# Patient Record
Sex: Female | Born: 2000
Health system: Southern US, Community
[De-identification: ages and names within clinical notes are randomized; demographics above are authoritative.]

## PROBLEM LIST (undated history)

## (undated) DIAGNOSIS — E559 Vitamin D deficiency, unspecified: Secondary | ICD-10-CM

## (undated) DIAGNOSIS — Z8659 Personal history of other mental and behavioral disorders: Secondary | ICD-10-CM

## (undated) DIAGNOSIS — J302 Other seasonal allergic rhinitis: Secondary | ICD-10-CM

## (undated) DIAGNOSIS — R159 Full incontinence of feces: Secondary | ICD-10-CM

## (undated) DIAGNOSIS — F419 Anxiety disorder, unspecified: Secondary | ICD-10-CM

## (undated) DIAGNOSIS — Z973 Presence of spectacles and contact lenses: Secondary | ICD-10-CM

## (undated) DIAGNOSIS — R109 Unspecified abdominal pain: Secondary | ICD-10-CM

## (undated) DIAGNOSIS — R195 Other fecal abnormalities: Secondary | ICD-10-CM

## (undated) DIAGNOSIS — O034 Incomplete spontaneous abortion without complication: Secondary | ICD-10-CM

## (undated) DIAGNOSIS — G43909 Migraine, unspecified, not intractable, without status migrainosus: Secondary | ICD-10-CM

## (undated) HISTORY — DX: Other fecal abnormalities: R19.5

## (undated) HISTORY — PX: NO PAST SURGERIES: SHX2092

## (undated) HISTORY — DX: Full incontinence of feces: R15.9

## (undated) HISTORY — DX: Unspecified abdominal pain: R10.9

---
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
## 2019-01-13 DIAGNOSIS — Z20828 Contact with and (suspected) exposure to other viral communicable diseases: Secondary | ICD-10-CM | POA: Diagnosis not present

## 2019-03-22 LAB — OB RESULTS CONSOLE GC/CHLAMYDIA
Chlamydia: NEGATIVE
Gonorrhea: NEGATIVE

## 2019-03-22 LAB — OB RESULTS CONSOLE GBS: GBS: NEGATIVE

## 2019-04-14 ENCOUNTER — Encounter (HOSPITAL_COMMUNITY): Payer: Self-pay

## 2019-04-14 ENCOUNTER — Other Ambulatory Visit: Payer: Self-pay

## 2019-04-14 ENCOUNTER — Inpatient Hospital Stay (HOSPITAL_COMMUNITY)
Admission: AD | Admit: 2019-04-14 | Discharge: 2019-04-17 | DRG: 807 | Disposition: A | Discharge status: Home / Self Care | Payer: BC Managed Care – PPO | Attending: Obstetrics and Gynecology | Admitting: Obstetrics and Gynecology

## 2019-04-14 ENCOUNTER — Inpatient Hospital Stay (EMERGENCY_DEPARTMENT_HOSPITAL)
Admission: AD | Admit: 2019-04-14 | Discharge: 2019-04-14 | Disposition: A | Discharge status: Home / Self Care | Payer: BC Managed Care – PPO | Source: Ambulatory Visit | Attending: Obstetrics & Gynecology | Admitting: Obstetrics & Gynecology

## 2019-04-14 DIAGNOSIS — Z20828 Contact with and (suspected) exposure to other viral communicable diseases: Secondary | ICD-10-CM | POA: Diagnosis not present

## 2019-04-14 DIAGNOSIS — Z3A39 39 weeks gestation of pregnancy: Secondary | ICD-10-CM

## 2019-04-14 DIAGNOSIS — O479 False labor, unspecified: Secondary | ICD-10-CM | POA: Diagnosis not present

## 2019-04-14 DIAGNOSIS — O26893 Other specified pregnancy related conditions, third trimester: Secondary | ICD-10-CM | POA: Diagnosis not present

## 2019-04-14 DIAGNOSIS — O471 False labor at or after 37 completed weeks of gestation: Secondary | ICD-10-CM | POA: Insufficient documentation

## 2019-04-14 LAB — CBC
HCT: 36.7 % (ref 36.0–46.0)
Hemoglobin: 11.9 g/dL — ABNORMAL LOW (ref 12.0–15.0)
MCH: 27.7 pg (ref 26.0–34.0)
MCHC: 32.4 g/dL (ref 30.0–36.0)
MCV: 85.3 fL (ref 80.0–100.0)
Platelets: 266 10*3/uL (ref 150–400)
RBC: 4.3 MIL/uL (ref 3.87–5.11)
RDW: 13 % (ref 11.5–15.5)
WBC: 14.2 10*3/uL — ABNORMAL HIGH (ref 4.0–10.5)
nRBC: 0 % (ref 0.0–0.2)

## 2019-04-14 LAB — OB RESULTS CONSOLE ABO/RH: RH Type: POSITIVE

## 2019-04-14 LAB — SARS CORONAVIRUS 2 BY RT PCR (HOSPITAL ORDER, PERFORMED IN ~~LOC~~ HOSPITAL LAB): SARS Coronavirus 2: NEGATIVE

## 2019-04-14 MED ORDER — LACTATED RINGERS IV SOLN
INTRAVENOUS | Status: DC
  Administered 2019-04-14 – 2019-04-15 (×4): via INTRAVENOUS

## 2019-04-14 MED ORDER — EPHEDRINE 5 MG/ML INJ: 10.0000 mg | INTRAVENOUS | Status: DC | PRN

## 2019-04-14 MED ORDER — PHENYLEPHRINE 40 MCG/ML (10ML) SYRINGE FOR IV PUSH (FOR BLOOD PRESSURE SUPPORT): 80.0000 ug | PREFILLED_SYRINGE | INTRAVENOUS | Status: DC | PRN

## 2019-04-14 MED ORDER — OXYCODONE-ACETAMINOPHEN 5-325 MG PO TABS: 1.0000 | ORAL_TABLET | ORAL | Status: DC | PRN

## 2019-04-14 MED ORDER — DIPHENHYDRAMINE HCL 50 MG/ML IJ SOLN: 12.5000 mg | INTRAMUSCULAR | Status: DC | PRN

## 2019-04-14 MED ORDER — ACETAMINOPHEN 325 MG PO TABS: 650.0000 mg | ORAL_TABLET | ORAL | Status: DC | PRN

## 2019-04-14 MED ORDER — FLEET ENEMA 7-19 GM/118ML RE ENEM: 1.0000 | ENEMA | RECTAL | Status: DC | PRN

## 2019-04-14 MED ORDER — SOD CITRATE-CITRIC ACID 500-334 MG/5ML PO SOLN: 30.0000 mL | ORAL | Status: DC | PRN

## 2019-04-14 MED ORDER — LACTATED RINGERS IV SOLN
500.0000 mL | Freq: Once | INTRAVENOUS | Status: AC
  Administered 2019-04-14: 500 mL via INTRAVENOUS

## 2019-04-14 MED ORDER — ONDANSETRON HCL 4 MG/2ML IJ SOLN: 4.0000 mg | Freq: Four times a day (QID) | INTRAMUSCULAR | Status: DC | PRN

## 2019-04-14 MED ORDER — LIDOCAINE HCL (PF) 1 % IJ SOLN: 30.0000 mL | INTRAMUSCULAR | Status: DC | PRN

## 2019-04-14 MED ORDER — OXYCODONE-ACETAMINOPHEN 5-325 MG PO TABS: 2.0000 | ORAL_TABLET | ORAL | Status: DC | PRN

## 2019-04-14 MED ORDER — OXYTOCIN 40 UNITS IN NORMAL SALINE INFUSION - SIMPLE MED
2.5000 [IU]/h | INTRAVENOUS | Status: DC
  Filled 2019-04-14: qty 1000

## 2019-04-14 MED ORDER — LACTATED RINGERS IV SOLN: 500.0000 mL | INTRAVENOUS | Status: DC | PRN

## 2019-04-14 MED ORDER — OXYTOCIN BOLUS FROM INFUSION
500.0000 mL | Freq: Once | INTRAVENOUS | Status: AC
  Administered 2019-04-15: 500 mL via INTRAVENOUS

## 2019-04-14 MED ORDER — FENTANYL-BUPIVACAINE-NACL 0.5-0.125-0.9 MG/250ML-% EP SOLN
12.0000 mL/h | EPIDURAL | Status: DC | PRN
  Filled 2019-04-14: qty 250

## 2019-04-14 NOTE — Discharge Instructions (Signed)
Braxton Hicks Contractions Contractions of the uterus can occur throughout pregnancy, but they are not always a sign that you are in labor. You may have practice contractions called Braxton Hicks contractions. These false labor contractions are sometimes confused with true labor. What are Braxton Hicks contractions? Braxton Hicks contractions are tightening movements that occur in the muscles of the uterus before labor. Unlike true labor contractions, these contractions do not result in opening (dilation) and thinning of the cervix. Toward the end of pregnancy (32-34 weeks), Braxton Hicks contractions can happen more often and may become stronger. These contractions are sometimes difficult to tell apart from true labor because they can be very uncomfortable. You should not feel embarrassed if you go to the hospital with false labor. Sometimes, the only way to tell if you are in true labor is for your health care provider to look for changes in the cervix. The health care provider will do a physical exam and may monitor your contractions. If you are not in true labor, the exam should show that your cervix is not dilating and your water has not broken. If there are no other health problems associated with your pregnancy, it is completely safe for you to be sent home with false labor. You may continue to have Braxton Hicks contractions until you go into true labor. How to tell the difference between true labor and false labor True labor  Contractions last 30-70 seconds.  Contractions become very regular.  Discomfort is usually felt in the top of the uterus, and it spreads to the lower abdomen and low back.  Contractions do not go away with walking.  Contractions usually become more intense and increase in frequency.  The cervix dilates and gets thinner. False labor  Contractions are usually shorter and not as strong as true labor contractions.  Contractions are usually irregular.  Contractions  are often felt in the front of the lower abdomen and in the groin.  Contractions may go away when you walk around or change positions while lying down.  Contractions get weaker and are shorter-lasting as time goes on.  The cervix usually does not dilate or become thin. Follow these instructions at home:   Take over-the-counter and prescription medicines only as told by your health care provider.  Keep up with your usual exercises and follow other instructions from your health care provider.  Eat and drink lightly if you think you are going into labor.  If Braxton Hicks contractions are making you uncomfortable: ? Change your position from lying down or resting to walking, or change from walking to resting. ? Sit and rest in a tub of warm water. ? Drink enough fluid to keep your urine pale yellow. Dehydration may cause these contractions. ? Do slow and deep breathing several times an hour.  Keep all follow-up prenatal visits as told by your health care provider. This is important. Contact a health care provider if:  You have a fever.  You have continuous pain in your abdomen. Get help right away if:  Your contractions become stronger, more regular, and closer together.  You have fluid leaking or gushing from your vagina.  You pass blood-tinged mucus (bloody show).  You have bleeding from your vagina.  You have low back pain that you never had before.  You feel your baby's head pushing down and causing pelvic pressure.  Your baby is not moving inside you as much as it used to. Summary  Contractions that occur before labor are   called Braxton Hicks contractions, false labor, or practice contractions.  Braxton Hicks contractions are usually shorter, weaker, farther apart, and less regular than true labor contractions. True labor contractions usually become progressively stronger and regular, and they become more frequent.  Manage discomfort from Braxton Hicks contractions  by changing position, resting in a warm bath, drinking plenty of water, or practicing deep breathing. This information is not intended to replace advice given to you by your health care provider. Make sure you discuss any questions you have with your health care provider. Document Released: 11/21/2016 Document Revised: 06/20/2017 Document Reviewed: 11/21/2016 Elsevier Patient Education  2020 Elsevier Inc.  

## 2019-04-14 NOTE — MAU Note (Signed)
Contractions 3-4 mins apart.  No LOF/VB.  + FM.  Reports no complications w/ pregnancy.  1 cm last week in the office.

## 2019-04-14 NOTE — MAU Note (Signed)
CTX 4 mins apart.  No LOF/VB.  + FM.  States she was 3-4 cm in the office today.  Also feeling pressure.

## 2019-04-14 NOTE — MAU Provider Note (Signed)
S: Ms. Diane Marshall is a 18 y.o. G1P0 at [redacted]w[redacted]d  who presents to MAU today for labor evaluation.     Cervical exam by RN:  Dilation: 2.5 Effacement (%): 70 Cervical Position: Middle Station: -1 Presentation: Vertex Exam by:: Esau Grew, RN  Fetal Monitoring: Baseline: 140 Variability: average Accelerations: present Decelerations: absent Contractions: every 5-6 min, mild  MDM Discussed patient with RN. NST reviewed.   A: SIUP at [redacted]w[redacted]d  False labor  P: Discharge home Labor precautions and kick counts included in AVS Patient to follow-up with office as scheduled  Patient may return to MAU as needed or when in labor   Kennis Carina 04/14/2019 6:27 AM

## 2019-04-14 NOTE — Anesthesia Preprocedure Evaluation (Signed)
Anesthesia Evaluation  Patient identified by MRN, date of birth, ID band Patient awake    Reviewed: Allergy & Precautions, Patient's Chart, lab work & pertinent test results  Airway Mallampati: II  TM Distance: >3 FB Neck ROM: Full    Dental no notable dental hx.    Pulmonary neg pulmonary ROS,    Pulmonary exam normal breath sounds clear to auscultation       Cardiovascular negative cardio ROS Normal cardiovascular exam Rhythm:Regular Rate:Normal     Neuro/Psych negative neurological ROS  negative psych ROS   GI/Hepatic negative GI ROS, Neg liver ROS,   Endo/Other  negative endocrine ROS  Renal/GU negative Renal ROS  negative genitourinary   Musculoskeletal negative musculoskeletal ROS (+)   Abdominal   Peds negative pediatric ROS (+)  Hematology negative hematology ROS (+)   Anesthesia Other Findings   Reproductive/Obstetrics (+) Pregnancy                             Anesthesia Physical Anesthesia Plan  ASA: I  Anesthesia Plan: Epidural   Post-op Pain Management:    Induction:   PONV Risk Score and Plan: 2  Airway Management Planned: Natural Airway  Additional Equipment: None  Intra-op Plan:   Post-operative Plan:   Informed Consent: I have reviewed the patients History and Physical, chart, labs and discussed the procedure including the risks, benefits and alternatives for the proposed anesthesia with the patient or authorized representative who has indicated his/her understanding and acceptance.       Plan Discussed with:   Anesthesia Plan Comments:         Anesthesia Quick Evaluation

## 2019-04-15 ENCOUNTER — Inpatient Hospital Stay (HOSPITAL_COMMUNITY): Payer: BC Managed Care – PPO | Admitting: Anesthesiology

## 2019-04-15 ENCOUNTER — Encounter (HOSPITAL_COMMUNITY): Payer: Self-pay | Admitting: *Deleted

## 2019-04-15 LAB — RPR: RPR Ser Ql: NONREACTIVE

## 2019-04-15 LAB — TYPE AND SCREEN
ABO/RH(D): O POS
Antibody Screen: NEGATIVE

## 2019-04-15 LAB — ABO/RH: ABO/RH(D): O POS

## 2019-04-15 MED ORDER — SODIUM CHLORIDE 0.9 % IV SOLN: 250.0000 mL | INTRAVENOUS | Status: DC | PRN

## 2019-04-15 MED ORDER — SENNOSIDES-DOCUSATE SODIUM 8.6-50 MG PO TABS
2.0000 | ORAL_TABLET | ORAL | Status: DC
  Administered 2019-04-15 – 2019-04-17 (×3): 2 via ORAL
  Filled 2019-04-15 (×2): qty 2

## 2019-04-15 MED ORDER — ONDANSETRON HCL 4 MG PO TABS: 4.0000 mg | ORAL_TABLET | ORAL | Status: DC | PRN

## 2019-04-15 MED ORDER — ZOLPIDEM TARTRATE 5 MG PO TABS: 5.0000 mg | ORAL_TABLET | Freq: Every evening | ORAL | Status: DC | PRN

## 2019-04-15 MED ORDER — SIMETHICONE 80 MG PO CHEW: 80.0000 mg | CHEWABLE_TABLET | ORAL | Status: DC | PRN

## 2019-04-15 MED ORDER — ACETAMINOPHEN 325 MG PO TABS
650.0000 mg | ORAL_TABLET | ORAL | Status: DC | PRN
  Administered 2019-04-16 – 2019-04-17 (×4): 650 mg via ORAL
  Filled 2019-04-15 (×4): qty 2

## 2019-04-15 MED ORDER — SODIUM CHLORIDE 0.9% FLUSH: 3.0000 mL | Freq: Two times a day (BID) | INTRAVENOUS | Status: DC

## 2019-04-15 MED ORDER — METHYLERGONOVINE MALEATE 0.2 MG PO TABS: 0.2000 mg | ORAL_TABLET | ORAL | Status: DC | PRN

## 2019-04-15 MED ORDER — SODIUM CHLORIDE (PF) 0.9 % IJ SOLN
INTRAMUSCULAR | Status: DC | PRN
  Administered 2019-04-15: 12 mL/h via EPIDURAL

## 2019-04-15 MED ORDER — BENZOCAINE-MENTHOL 20-0.5 % EX AERO: 1.0000 "application " | INHALATION_SPRAY | CUTANEOUS | Status: DC | PRN

## 2019-04-15 MED ORDER — WITCH HAZEL-GLYCERIN EX PADS: 1.0000 "application " | MEDICATED_PAD | CUTANEOUS | Status: DC | PRN

## 2019-04-15 MED ORDER — MEASLES, MUMPS & RUBELLA VAC IJ SOLR: 0.5000 mL | Freq: Once | INTRAMUSCULAR | Status: DC

## 2019-04-15 MED ORDER — PRENATAL MULTIVITAMIN CH
1.0000 | ORAL_TABLET | Freq: Every day | ORAL | Status: DC
  Administered 2019-04-16 – 2019-04-17 (×2): 1 via ORAL
  Filled 2019-04-15 (×2): qty 1

## 2019-04-15 MED ORDER — MAGNESIUM HYDROXIDE 400 MG/5ML PO SUSP: 30.0000 mL | ORAL | Status: DC | PRN

## 2019-04-15 MED ORDER — DIBUCAINE (PERIANAL) 1 % EX OINT: 1.0000 "application " | TOPICAL_OINTMENT | CUTANEOUS | Status: DC | PRN

## 2019-04-15 MED ORDER — FERROUS SULFATE 325 (65 FE) MG PO TABS
325.0000 mg | ORAL_TABLET | Freq: Two times a day (BID) | ORAL | Status: DC
  Administered 2019-04-15 – 2019-04-17 (×4): 325 mg via ORAL
  Filled 2019-04-15 (×4): qty 1

## 2019-04-15 MED ORDER — METHYLERGONOVINE MALEATE 0.2 MG/ML IJ SOLN: 0.2000 mg | INTRAMUSCULAR | Status: DC | PRN

## 2019-04-15 MED ORDER — COCONUT OIL OIL: 1.0000 "application " | TOPICAL_OIL | Status: DC | PRN

## 2019-04-15 MED ORDER — SODIUM CHLORIDE 0.9% FLUSH: 3.0000 mL | INTRAVENOUS | Status: DC | PRN

## 2019-04-15 MED ORDER — DIPHENHYDRAMINE HCL 25 MG PO CAPS: 25.0000 mg | ORAL_CAPSULE | Freq: Four times a day (QID) | ORAL | Status: DC | PRN

## 2019-04-15 MED ORDER — LIDOCAINE HCL (PF) 1 % IJ SOLN
INTRAMUSCULAR | Status: DC | PRN
  Administered 2019-04-15: 10 mL via EPIDURAL
  Administered 2019-04-15: 2 mL via EPIDURAL

## 2019-04-15 MED ORDER — ONDANSETRON HCL 4 MG/2ML IJ SOLN: 4.0000 mg | INTRAMUSCULAR | Status: DC | PRN

## 2019-04-15 MED ORDER — IBUPROFEN 800 MG PO TABS
800.0000 mg | ORAL_TABLET | Freq: Three times a day (TID) | ORAL | Status: DC
  Administered 2019-04-15 – 2019-04-17 (×6): 800 mg via ORAL
  Filled 2019-04-15 (×6): qty 1

## 2019-04-15 MED ORDER — TETANUS-DIPHTH-ACELL PERTUSSIS 5-2.5-18.5 LF-MCG/0.5 IM SUSP: 0.5000 mL | Freq: Once | INTRAMUSCULAR | Status: DC

## 2019-04-15 MED ORDER — OXYCODONE-ACETAMINOPHEN 5-325 MG PO TABS: 2.0000 | ORAL_TABLET | ORAL | Status: DC | PRN

## 2019-04-15 NOTE — H&P (Signed)
18 y.o. [redacted]w[redacted]d  G1P0000 comes in c/o labor.  Otherwise has good fetal movement and no bleeding.  Past Medical History:  Diagnosis Date  . Abdominal pain   . Encopresis   . Loose stools     Past Surgical History:  Procedure Laterality Date  . NO PAST SURGERIES      OB History  Gravida Para Term Preterm AB Living  1 0 0 0 0 0  SAB TAB Ectopic Multiple Live Births  0 0 0 0 0    # Outcome Date GA Lbr Len/2nd Weight Sex Delivery Anes PTL Lv  1 Current             Social History   Socioeconomic History  . Marital status: Single    Spouse name: Not on file  . Number of children: Not on file  . Years of education: Not on file  . Highest education level: Not on file  Occupational History  . Not on file  Social Needs  . Financial resource strain: Not on file  . Food insecurity    Worry: Not on file    Inability: Not on file  . Transportation needs    Medical: Not on file    Non-medical: Not on file  Tobacco Use  . Smoking status: Never Smoker  . Smokeless tobacco: Never Used  Substance and Sexual Activity  . Alcohol use: Never    Frequency: Never  . Drug use: Never  . Sexual activity: Yes  Lifestyle  . Physical activity    Days per week: Not on file    Minutes per session: Not on file  . Stress: Not on file  Relationships  . Social Herbalist on phone: Not on file    Gets together: Not on file    Attends religious service: Not on file    Active member of club or organization: Not on file    Attends meetings of clubs or organizations: Not on file    Relationship status: Not on file  . Intimate partner violence    Fear of current or ex partner: Not on file    Emotionally abused: Not on file    Physically abused: Not on file    Forced sexual activity: Not on file  Other Topics Concern  . Not on file  Social History Narrative   8th grade 2014-2015   Patient has no known allergies.    Prenatal Transfer Tool  Maternal Diabetes: No Genetic Screening:  Normal Maternal Ultrasounds/Referrals: Normal Fetal Ultrasounds or other Referrals:  None Maternal Substance Abuse:  No Significant Maternal Medications:  None Significant Maternal Lab Results: Group B Strep negative  Other PNC: uncomplicated.    Vitals:   04/15/19 0936 04/15/19 1001 04/15/19 1031 04/15/19 1101  BP: 125/72 (!) 104/58 116/72 127/81  Pulse: 96 (!) 103 (!) 102 (!) 106  Resp: 18 18 18 16   Temp:      TempSrc:      SpO2:      Weight:      Height:        Lungs/Cor:  NAD Abdomen:  soft, gravid Ex:  no cords, erythema SVE:  4 on admission FHTs:  good STV, NST R; Cat 1 tracing. Toco:  q q 3-4   A/P   Term labor.   GBS neg.  Daria Pastures

## 2019-04-15 NOTE — Anesthesia Procedure Notes (Signed)
Epidural Patient location during procedure: OB Start time: 04/15/2019 12:01 AM End time: 04/15/2019 12:10 AM  Staffing Anesthesiologist: Pervis Hocking, DO Performed: anesthesiologist   Preanesthetic Checklist Completed: patient identified, pre-op evaluation, timeout performed, IV checked, risks and benefits discussed and monitors and equipment checked  Epidural Patient position: sitting Prep: site prepped and draped and DuraPrep Patient monitoring: continuous pulse ox, blood pressure, heart rate and cardiac monitor Approach: midline Location: L3-L4 Injection technique: LOR air  Needle:  Needle type: Tuohy  Needle gauge: 17 G Needle length: 9 cm Needle insertion depth: 6 cm Catheter type: closed end flexible Catheter size: 19 Gauge Catheter at skin depth: 12 cm Test dose: negative  Assessment Sensory level: T8 Events: blood not aspirated, injection not painful, no injection resistance, negative IV test and no paresthesia  Additional Notes Patient identified. Risks/Benefits/Options discussed with patient including but not limited to bleeding, infection, nerve damage, paralysis, failed block, incomplete pain control, headache, blood pressure changes, nausea, vomiting, reactions to medication both or allergic, itching and postpartum back pain. Confirmed with bedside nurse the patient's most recent platelet count. Confirmed with patient that they are not currently taking any anticoagulation, have any bleeding history or any family history of bleeding disorders. Patient expressed understanding and wished to proceed. All questions were answered. Sterile technique was used throughout the entire procedure. Please see nursing notes for vital signs. Test dose was given through epidural catheter and negative prior to continuing to dose epidural or start infusion. Warning signs of high block given to the patient including shortness of breath, tingling/numbness in hands, complete motor  block, or any concerning symptoms with instructions to call for help. Patient was given instructions on fall risk and not to get out of bed. All questions and concerns addressed with instructions to call with any issues or inadequate analgesia.  Reason for block:procedure for pain

## 2019-04-16 LAB — CBC
HCT: 32.7 % — ABNORMAL LOW (ref 36.0–46.0)
Hemoglobin: 10.5 g/dL — ABNORMAL LOW (ref 12.0–15.0)
MCH: 27.7 pg (ref 26.0–34.0)
MCHC: 32.1 g/dL (ref 30.0–36.0)
MCV: 86.3 fL (ref 80.0–100.0)
Platelets: 253 10*3/uL (ref 150–400)
RBC: 3.79 MIL/uL — ABNORMAL LOW (ref 3.87–5.11)
RDW: 13.2 % (ref 11.5–15.5)
WBC: 11.9 10*3/uL — ABNORMAL HIGH (ref 4.0–10.5)
nRBC: 0 % (ref 0.0–0.2)

## 2019-04-16 NOTE — Anesthesia Postprocedure Evaluation (Signed)
Anesthesia Post Note  Patient: Diane Marshall  Procedure(s) Performed: AN AD HOC LABOR EPIDURAL     Patient location during evaluation: Mother Baby Anesthesia Type: Epidural Level of consciousness: awake and alert and oriented Pain management: satisfactory to patient Vital Signs Assessment: post-procedure vital signs reviewed and stable Respiratory status: spontaneous breathing and nonlabored ventilation Cardiovascular status: stable Postop Assessment: no headache, no backache, no signs of nausea or vomiting, adequate PO intake, patient able to bend at knees and able to ambulate (patient up walking) Anesthetic complications: no    Last Vitals:  Vitals:   04/16/19 0000 04/16/19 0400  BP: (!) 109/56 (!) 112/54  Pulse: 89 96  Resp: 18 18  Temp: 36.6 C 36.7 C  SpO2: 100% 98%    Last Pain:  Vitals:   04/16/19 0514  TempSrc:   PainSc: 5    Pain Goal: Patients Stated Pain Goal: 9 (04/15/19 0715)                 Willa Rough

## 2019-04-16 NOTE — Lactation Note (Signed)
This note was copied from a baby's chart. Lactation Consultation Note  Patient Name: Diane Marshall [id number] Date: 04/16/2019 Reason for consult: Initial assessment;1st time breastfeeding;Term P1, 68 hour female infant. Infant had 2 voids and 3 stools since birth. Mom is active on the [facility] program in [facility] and mom has DEBP at home. Per mom, she feels breastfeeding is going well, infant latched in L&D for 20 minutes and infant is breastfeeding 10-20 minutes most feedings. Mom latched infant on left breast using cross cradle hold, swallows were observed and nose and chin was touching breast. Mom latched infant without assistance from [facility] and mom was still breastfeeding after 8 minutes when LC left the room. Mom knows to breastfeed infant according hunger cues, 8 to 12 times within 24 hours and on demand. Reviewed Baby & Me book's Breastfeeding Basics.  Mom made aware of O/P services, breastfeeding support groups, community resources, and our phone # for post-discharge questions.   Maternal Data Formula Feeding for Exclusion: No Has patient been taught Hand Expression?: Yes Does the patient have breastfeeding experience prior to this delivery?: No  Feeding Feeding Type: Breast Fed  LATCH Score Latch: Grasps breast easily, tongue down, lips flanged, rhythmical sucking.  Audible Swallowing: Spontaneous and intermittent  Type of Nipple: Everted at rest and after stimulation  Comfort (Breast/Nipple): Soft / non-tender  Hold (Positioning): No assistance needed to correctly position infant at breast.  LATCH Score: 10  Interventions Interventions: Breast feeding basics reviewed;Breast compression;Assisted with latch;Adjust position;Skin to skin;Support pillows;Position options;Breast massage;Hand express;Expressed milk  Lactation Tools Discussed/Used WIC Program: Yes   Consult Status Consult Status: Follow-up Date: 04/16/19 Follow-up type: In-patient    Vicente Serene 04/16/2019, 4:33 AM

## 2019-04-16 NOTE — Progress Notes (Signed)
Patient is eating, ambulating, voiding.  Pain control is good.  Doing well, no complaints.  Vitals:   04/15/19 1550 04/15/19 2000 04/16/19 0000 04/16/19 0400  BP: 119/66 (!) 99/53 (!) 109/56 (!) 112/54  Pulse: 92 86 89 96  Resp: 18 18 18 18   Temp: 98.4 F (36.9 C) 98.1 F (36.7 C) 97.9 F (36.6 C) 98.1 F (36.7 C)  TempSrc: Oral Oral Oral Oral  SpO2: 98% 100% 100% 98%  Weight:      Height:        Fundus firm Ext: no calf tenderness  Lab Results  Component Value Date   WBC 11.9 (H) 04/16/2019   HGB 10.5 (L) 04/16/2019   HCT 32.7 (L) 04/16/2019   MCV 86.3 04/16/2019   PLT 253 04/16/2019    --/--/O POS, O POS Performed at [facility] [address] [place], [place] [address]  (09/23 2230)  A/P Post partum day 1 Doing well  Routine care.     Allyn Kenner

## 2019-04-17 NOTE — Discharge Summary (Signed)
Obstetric Discharge Summary Reason for Admission: onset of labor Prenatal Procedures: ultrasound Intrapartum Procedures: spontaneous vaginal delivery Postpartum Procedures: none Complications-Operative and Postpartum: 1st degree perineal laceration Hemoglobin  Date Value Ref Range Status  04/16/2019 10.5 (L) 12.0 - 15.0 g/dL Final   HCT  Date Value Ref Range Status  04/16/2019 32.7 (L) 36.0 - 46.0 % Final    Physical Exam:  General: alert and cooperative Lochia: appropriate Uterine Fundus: firm DVT Evaluation: No evidence of DVT seen on physical exam.  Discharge Diagnoses: Term Pregnancy-delivered  Discharge Information: Date: 04/17/2019 Activity: pelvic rest Diet: routine Medications: PNV and Ibuprofen Condition: stable Instructions: refer to practice specific booklet Discharge to: home Follow-up Information    Bobbye Charleston, MD Follow up in 4 week(s).   Specialty: Obstetrics and Gynecology Contact information: [place] [address] [phone]           Newborn Data: Live born female  Birth Weight: 6 lb 8.6 oz (2965 g) APGAR: 53, 9  Newborn Delivery   Birth date/time: 04/15/2019 11:32:00 Delivery type: Vaginal, Spontaneous      Home with mother.  Allyn Kenner 04/17/2019, 10:33 AM

## 2019-04-17 NOTE — Lactation Note (Signed)
This note was copied from a baby's chart. Lactation Consultation Note  Patient Name: Diane Marshall [id number] Date: 04/17/2019 Reason for consult: Follow-up assessment   Baby 5 hours old and recently gave baby 30 ml of formula because she was worried about volume baby was getting with breastfeeding. Reassured mother and praised her for her efforts so far. Provided her with a manual pump. Discussed supply and demand and encouraged her to bf before offering formula. Reviewed with her volume guidelines for supplementation. Feed on demand with cues.  Goal 8-12+ times per day after first 24 hrs.  Place baby STS if not cueing.  Reviewed engorgement care and monitoring voids/stools. Mother will call RN when she breastfeeds next time to check latch.    Maternal Data    Feeding Feeding Type: Bottle Fed - Formula  LATCH Score                   Interventions Interventions: Breast feeding basics reviewed;Hand pump  Lactation Tools Discussed/Used     Consult Status Consult Status: Complete Date: 04/17/19    Diane Marshall [facility] 04/17/2019, 9:41 AM

## 2019-06-30 ENCOUNTER — Telehealth: Payer: Self-pay | Admitting: Adult Health

## 2019-06-30 DIAGNOSIS — Z20828 Contact with and (suspected) exposure to other viral communicable diseases: Secondary | ICD-10-CM | POA: Diagnosis not present

## 2019-06-30 NOTE — Telephone Encounter (Signed)
Pt advised that she will need telehealth visit prior to any medications being prescribed.  Pt expressed understanding and was transferred to front desk to schedule appt.  Charyl Bigger, CMA

## 2019-06-30 NOTE — Progress Notes (Signed)
Virtual Visit via Telephone Note  I connected with Diane Marshall on 07/01/2019 at  1:45 PM EST by telephone and verified that I am speaking with the correct person using two identifiers.  Location: Patient:Home Provider:In Clinic   I discussed the limitations, risks, security and privacy concerns of performing an evaluation and management service by telephone and the availability of in person appointments. I also discussed with the patient that there may be a patient responsible charge related to this service. The patient expressed understanding and agreed to proceed.   History of Present Illness: Diane Marshall calls in with low- grade temp (highest temp 100.49f oral), yellow-green nasal drainage, productive cough (green mucus), and intermittent frontal HA 4/10- described as pressure- sx's present for 4 days. She denied any recent exposure to [facility] virus, however stated that she did have exposure when she was treated at [facility] yestarday. At yesterday's visit-Temp yesterday 99.56f oral Sat on RA 99% POCT Covid Sofia  Result Value Ref Range  SARS-COV-2 (ANTIGEN SCREEN)(AKA SOFIA) Negative  She was not given any Rx's and provided quarantining information.  She has been using OTC Cold/Sinus with sx relief.  She delivered in Sept 2020- she is NOT breastfeeding.  She denies tobacco/vape use   Patient Care Team    Relationship Specialty Notifications Start End  William Hamburger D, NP PCP - General Family Medicine  05/06/18   Ob/Gyn, Nestor Ramp    05/06/18     Patient Active Problem List   Diagnosis Date Noted  . Normal labor 04/14/2019  . Urinary frequency 09/29/2018  . Healthcare maintenance 05/06/2018  . Lower abdominal pain   . Encopresis   . Alternating constipation and diarrhea      Past Medical History:  Diagnosis Date  . Abdominal pain   . Encopresis   . Loose stools      Past Surgical History:  Procedure Laterality Date  . NO PAST SURGERIES       Family History   Problem Relation Age of Onset  . Lactose intolerance Sister   . Hypertension Sister   . Hypertension Sister   . Lactose intolerance Father   . Hypertension Father   . Cholelithiasis Paternal Grandmother   . Cancer Paternal Grandmother        colon  . Hypertension Paternal Grandmother   . Heart attack Maternal Grandfather   . Celiac disease Neg Hx   . Ulcers Neg Hx   . Irritable bowel syndrome Neg Hx      Social History   Substance and Sexual Activity  Drug Use Never     Social History   Substance and Sexual Activity  Alcohol Use Never     Social History   Tobacco Use  Smoking Status Never Smoker  Smokeless Tobacco Never Used     Outpatient Encounter Medications as of 07/01/2019  Medication Sig  . acetaminophen (TYLENOL) 325 MG tablet Take 650 mg by mouth every 6 (six) hours as needed.  . benzonatate (TESSALON) 100 MG capsule Take 1 capsule (100 mg total) by mouth 2 (two) times daily as needed for cough.  . [DISCONTINUED] Prenatal Vit-Fe Fumarate-FA (PRENATAL MULTIVITAMIN) TABS tablet Take 1 tablet by mouth daily at 12 noon.   No facility-administered encounter medications on file as of 07/01/2019.    Allergies: Patient has no known allergies.  Body mass index is 33.74 kg/m.  Temperature 97.7 F (36.5 C), temperature source Oral, height 5' 1.75" (1.568 m), weight 183 lb (83 kg), not currently breastfeeding.  Review of Systems: General:   Denies fever, chills, unexplained weight loss.  Optho/Auditory:   Denies visual changes, blurred vision/LOV Respiratory:   Denies SOB, DOE more than baseline levels.  Cardiovascular:   Denies chest pain, palpitations, new onset peripheral edema  Gastrointestinal:   Denies nausea, vomiting, diarrhea.  Genitourinary: Denies dysuria, freq/ urgency, flank pain or discharge from genitals.  Endocrine:     Denies hot or cold intolerance, polyuria, polydipsia. Musculoskeletal:   Denies unexplained myalgias, joint swelling,  unexplained arthralgias, gait problems.  Skin:  Denies rash, suspicious lesions Neurological:     Denies dizziness, unexplained weakness, numbness  Psychiatric/Behavioral:   Denies mood changes, suicidal or homicidal ideations, hallucinations  Observations/Objective: No acute distress noted during telephone conversation.  Assessment and Plan: Increase fluids rest. Tessalon 100mg  BID PRN Continue quarantine per CDC guidelines- due to sx's and + exposure. If still experiencing sx's past 07/07/2019- contact clinic and will treat with ABX.  Follow Up Instructions: PRN   I discussed the assessment and treatment plan with the patient. The patient was provided an opportunity to ask questions and all were answered. The patient agreed with the plan and demonstrated an understanding of the instructions.   The patient was advised to call back or seek an in-person evaluation if the symptoms worsen or if the condition fails to improve as anticipated.  I provided 8 minutes of non-face-to-face time during this encounter.   Esaw Grandchild, NP

## 2019-06-30 NOTE — Telephone Encounter (Signed)
Patient called states she has some nasal congestions & wishes Rx to be called into pharmacy--- Advised pt provider may have her be COVID tested & results back before any Rx written.  --Forwarding message to med asst to contact pt w/ advice @ [phone]  -glh

## 2019-07-01 ENCOUNTER — Ambulatory Visit (INDEPENDENT_AMBULATORY_CARE_PROVIDER_SITE_OTHER): Payer: BC Managed Care – PPO | Admitting: Adult Health

## 2019-07-01 ENCOUNTER — Encounter: Payer: Self-pay | Admitting: Adult Health

## 2019-07-01 ENCOUNTER — Other Ambulatory Visit: Payer: Self-pay

## 2019-07-01 DIAGNOSIS — R059 Cough, unspecified: Secondary | ICD-10-CM | POA: Insufficient documentation

## 2019-07-01 DIAGNOSIS — R05 Cough: Secondary | ICD-10-CM | POA: Diagnosis not present

## 2019-07-01 MED ORDER — BENZONATATE 100 MG PO CAPS: 100.0000 mg | ORAL_CAPSULE | Freq: Two times a day (BID) | ORAL | 0 refills | Status: DC | PRN

## 2019-07-01 NOTE — Assessment & Plan Note (Signed)
Assessment and Plan: Increase fluids rest. Tessalon 100mg  BID PRN Continue quarantine per CDC guidelines- due to sx's and + exposure. If still experiencing sx's past 07/07/2019- contact clinic and will treat with ABX.  Follow Up Instructions: PRN   I discussed the assessment and treatment plan with the patient. The patient was provided an opportunity to ask questions and all were answered. The patient agreed with the plan and demonstrated an understanding of the instructions.   The patient was advised to call back or seek an in-person evaluation if the symptoms worsen or if the condition fails to improve as anticipated.

## 2019-07-18 DIAGNOSIS — W19XXXA Unspecified fall, initial encounter: Secondary | ICD-10-CM | POA: Diagnosis not present

## 2019-07-18 DIAGNOSIS — S0993XA Unspecified injury of face, initial encounter: Secondary | ICD-10-CM | POA: Diagnosis not present

## 2019-07-18 DIAGNOSIS — G43901 Migraine, unspecified, not intractable, with status migrainosus: Secondary | ICD-10-CM | POA: Diagnosis not present

## 2019-07-18 DIAGNOSIS — R55 Syncope and collapse: Secondary | ICD-10-CM | POA: Diagnosis not present

## 2019-07-18 DIAGNOSIS — S0093XA Contusion of unspecified part of head, initial encounter: Secondary | ICD-10-CM | POA: Diagnosis not present

## 2019-07-18 DIAGNOSIS — R531 Weakness: Secondary | ICD-10-CM | POA: Diagnosis not present

## 2019-07-18 DIAGNOSIS — E860 Dehydration: Secondary | ICD-10-CM | POA: Diagnosis not present

## 2019-07-21 DIAGNOSIS — Z01419 Encounter for gynecological examination (general) (routine) without abnormal findings: Secondary | ICD-10-CM | POA: Diagnosis not present

## 2019-07-21 DIAGNOSIS — Z6834 Body mass index (BMI) 34.0-34.9, adult: Secondary | ICD-10-CM | POA: Diagnosis not present

## 2019-08-30 NOTE — Progress Notes (Deleted)
   Subjective:    Patient ID: Diane Marshall, female    DOB: 08-03-2000, 19 y.o.   MRN: [id number]  HPI:  Diane Marshall is here for medical weight loss  PDMP reviewed- no Rx's listed in 2 yr look back Patient Care Team    Relationship Specialty Notifications Start End  Julaine Fusi, NP PCP - General Family Medicine  05/06/18   Ob/Gyn, Nestor Ramp    05/06/18     Patient Active Problem List   Diagnosis Date Noted  . Cough in adult 07/01/2019  . Normal labor 04/14/2019  . Urinary frequency 09/29/2018  . Healthcare maintenance 05/06/2018  . Lower abdominal pain   . Encopresis   . Alternating constipation and diarrhea      Past Medical History:  Diagnosis Date  . Abdominal pain   . Encopresis   . Loose stools      Past Surgical History:  Procedure Laterality Date  . NO PAST SURGERIES       Family History  Problem Relation Age of Onset  . Lactose intolerance Sister   . Hypertension Sister   . Hypertension Sister   . Lactose intolerance Father   . Hypertension Father   . Cholelithiasis Paternal Grandmother   . Cancer Paternal Grandmother        colon  . Hypertension Paternal Grandmother   . Heart attack Maternal Grandfather   . Celiac disease Neg Hx   . Ulcers Neg Hx   . Irritable bowel syndrome Neg Hx      Social History   Substance and Sexual Activity  Drug Use Never     Social History   Substance and Sexual Activity  Alcohol Use Never     Social History   Tobacco Use  Smoking Status Never Smoker  Smokeless Tobacco Never Used     Outpatient Encounter Medications as of 08/31/2019  Medication Sig  . acetaminophen (TYLENOL) 325 MG tablet Take 650 mg by mouth every 6 (six) hours as needed.  . benzonatate (TESSALON) 100 MG capsule Take 1 capsule (100 mg total) by mouth 2 (two) times daily as needed for cough.   No facility-administered encounter medications on file as of 08/31/2019.    Allergies: Patient has no known allergies.  There is  no height or weight on file to calculate BMI.  not currently breastfeeding.     Review of Systems     Objective:   Physical Exam        Assessment & Plan:  No diagnosis found.  No problem-specific Assessment & Plan notes found for this encounter.    FOLLOW-UP:  No follow-ups on file.

## 2019-08-31 ENCOUNTER — Ambulatory Visit: Payer: BC Managed Care – PPO | Admitting: Adult Health

## 2019-09-01 ENCOUNTER — Encounter: Payer: Self-pay | Admitting: Adult Health

## 2019-09-01 ENCOUNTER — Other Ambulatory Visit: Payer: Self-pay

## 2019-09-01 ENCOUNTER — Ambulatory Visit (INDEPENDENT_AMBULATORY_CARE_PROVIDER_SITE_OTHER): Payer: BC Managed Care – PPO | Admitting: Adult Health

## 2019-09-01 VITALS — BP 121/78 | HR 67 | Temp 97.9°F | Ht 61.75 in | Wt 192.2 lb

## 2019-09-01 DIAGNOSIS — Z6839 Body mass index (BMI) 39.0-39.9, adult: Secondary | ICD-10-CM | POA: Insufficient documentation

## 2019-09-01 DIAGNOSIS — Z6835 Body mass index (BMI) 35.0-35.9, adult: Secondary | ICD-10-CM | POA: Insufficient documentation

## 2019-09-01 DIAGNOSIS — R635 Abnormal weight gain: Secondary | ICD-10-CM | POA: Diagnosis not present

## 2019-09-01 MED ORDER — PHENTERMINE HCL 37.5 MG PO TABS: 37.5000 mg | ORAL_TABLET | Freq: Every day | ORAL | 0 refills | Status: DC

## 2019-09-01 NOTE — Assessment & Plan Note (Signed)
Current wt 192 Body mass index is 35.44 kg/m.  PDMP reviewed- no rx's listed on 2 yr look back We will call you with lab results. Increase water intake, strive for at least 80 ounces/day.   Follow Heart Healthy diet Increase regular exercise.  Recommend at least 30 minutes daily, 5 days per week of walking,biking, swimming, YouTube/Pinterest workout videos. Start Phentermine once daily. Continue to social distance and wear a mask when in public. Follow-up in 4 weeks.

## 2019-09-01 NOTE — Patient Instructions (Addendum)
Exercising to Lose Weight Exercise is structured, repetitive physical activity to improve fitness and health. Getting regular exercise is important for everyone. It is especially important if you are overweight. Being overweight increases your risk of heart disease, stroke, diabetes, high blood pressure, and several types of cancer. Reducing your calorie intake and exercising can help you lose weight. Exercise is usually categorized as moderate or vigorous intensity. To lose weight, most people need to do a certain amount of moderate-intensity or vigorous-intensity exercise each week. Moderate-intensity exercise  Moderate-intensity exercise is any activity that gets you moving enough to burn at least three times more energy (calories) than if you were sitting. Examples of moderate exercise include:  Walking a mile in 15 minutes.  Doing light yard work.  Biking at an easy pace. Most people should get at least 150 minutes (2 hours and 30 minutes) a week of moderate-intensity exercise to maintain their body weight. Vigorous-intensity exercise Vigorous-intensity exercise is any activity that gets you moving enough to burn at least six times more calories than if you were sitting. When you exercise at this intensity, you should be working hard enough that you are not able to carry on a conversation. Examples of vigorous exercise include:  Running.  Playing a team sport, such as football, basketball, and soccer.  Jumping rope. Most people should get at least 75 minutes (1 hour and 15 minutes) a week of vigorous-intensity exercise to maintain their body weight. How can exercise affect me? When you exercise enough to burn more calories than you eat, you lose weight. Exercise also reduces body fat and builds muscle. The more muscle you have, the more calories you burn. Exercise also:  Improves mood.  Reduces stress and tension.  Improves your overall fitness, flexibility, and  endurance.  Increases bone strength. The amount of exercise you need to lose weight depends on:  Your age.  The type of exercise.  Any health conditions you have.  Your overall physical ability. Talk to your health care provider about how much exercise you need and what types of activities are safe for you. What actions can I take to lose weight? Nutrition   Make changes to your diet as told by your health care provider or diet and nutrition specialist (dietitian). This may include: ? Eating fewer calories. ? Eating more protein. ? Eating less unhealthy fats. ? Eating a diet that includes fresh fruits and vegetables, whole grains, low-fat dairy products, and lean protein. ? Avoiding foods with added fat, salt, and sugar.  Drink plenty of water while you exercise to prevent dehydration or heat stroke. Activity  Choose an activity that you enjoy and set realistic goals. Your health care provider can help you make an exercise plan that works for you.  Exercise at a moderate or vigorous intensity most days of the week. ? The intensity of exercise may vary from person to person. You can tell how intense a workout is for you by paying attention to your breathing and heartbeat. Most people will notice their breathing and heartbeat get faster with more intense exercise.  Do resistance training twice each week, such as: ? Push-ups. ? Sit-ups. ? Lifting weights. ? Using resistance bands.  Getting short amounts of exercise can be just as helpful as long structured periods of exercise. If you have trouble finding time to exercise, try to include exercise in your daily routine. ? Get up, stretch, and walk around every 30 minutes throughout the day. ? Go for a   walk during your lunch break. ? Park your car farther away from your destination. ? If you take public transportation, get off one stop early and walk the rest of the way. ? Make phone calls while standing up and walking  around. ? Take the stairs instead of elevators or escalators.  Wear comfortable clothes and shoes with good support.  Do not exercise so much that you hurt yourself, feel dizzy, or get very short of breath. Where to find more information  U.S. Department of Health and Human Services: ThisPath.fi  Centers for Disease Control and Prevention (CDC): FootballExhibition.com.br Contact a health care provider:  Before starting a new exercise program.  If you have questions or concerns about your weight.  If you have a medical problem that keeps you from exercising. Get help right away if you have any of the following while exercising:  Injury.  Dizziness.  Difficulty breathing or shortness of breath that does not go away when you stop exercising.  Chest pain.  Rapid heartbeat. Summary  Being overweight increases your risk of heart disease, stroke, diabetes, high blood pressure, and several types of cancer.  Losing weight happens when you burn more calories than you eat.  Reducing the amount of calories you eat in addition to getting regular moderate or vigorous exercise each week helps you lose weight. This information is not intended to replace advice given to you by your health care provider. Make sure you discuss any questions you have with your health care provider. Document Revised: 07/21/2017 Document Reviewed: 07/21/2017 Elsevier Patient Education  2020 [employer].  We will call you with lab results. Increase water intake, strive for at least 80 ounces/day.   Follow Heart Healthy diet Increase regular exercise.  Recommend at least 30 minutes daily, 5 days per week of walking,biking, swimming, YouTube/Pinterest workout videos. Start Phentermine once daily. Continue to social distance and wear a mask when in public. Follow-up in 4 weeks. GREAT TO SEE YOU!

## 2019-09-01 NOTE — Progress Notes (Signed)
Subjective:    Patient ID: Diane Marshall, female    DOB: [birth year], 19 y.o.   MRN: [id number]  HPI:  Diane Marshall is here to discuss medical wt loss. Current wt 192 Body mass index is 35.44 kg/m. PDMP reviewed- no rx's listed on 2 yr look back. 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) wt despite the following healthy measures- 1) drinks >50 oz water/day 2) reducing sugar/CHO/snacking/fast food intake 3) daily walking 4) daily body weight exercise via "App" on phone 5) frequent walking/standing during 5 x 10 hr shifts at [employer]  Diane Marshall denies tobacco/vape/ETOH use Diane Marshall denies acute cardio-pulmonary sx's  Discussed typical course of phentermine- 3 months one, one month off, final 36months Need f/u in 4 weeks after initiation   Patient Care Team    Relationship Specialty Notifications Start End  Mina Marble D, NP PCP - General Family Medicine  05/06/18   Ob/Gyn, [facility]    05/06/18     Patient Active Problem List   Diagnosis Date Noted  . BMI 35.0-35.9,adult 09/01/2019  . Cough in adult 07/01/2019  . Normal labor 04/14/2019  . Urinary frequency 09/29/2018  . Healthcare maintenance 05/06/2018  . Lower abdominal pain   . Encopresis   . Alternating constipation and diarrhea      Past Medical History:  Diagnosis Date  . Abdominal pain   . Encopresis   . Loose stools      Past Surgical History:  Procedure Laterality Date  . NO PAST SURGERIES       Family History  Problem Relation Age of Onset  . Lactose intolerance Sister   . Hypertension Sister   . Hypertension Sister   . Lactose intolerance Father   . Hypertension Father   . Cholelithiasis Paternal Grandmother   . Cancer Paternal Grandmother        colon  . Hypertension Paternal Grandmother   . Heart attack Maternal Grandfather   . Celiac disease Neg Hx   . Ulcers Neg Hx   . Irritable bowel syndrome Neg Hx      Social History   Substance and Sexual Activity   Drug Use Never     Social History   Substance and Sexual Activity  Alcohol Use Never     Social History   Tobacco Use  Smoking Status Never Smoker  Smokeless Tobacco Never Used     Outpatient Encounter Medications as of 09/01/2019  Medication Sig  . etonogestrel-ethinyl estradiol (NUVARING) 0.12-0.015 MG/24HR vaginal ring   . phentermine (ADIPEX-P) 37.5 MG tablet Take 1 tablet (37.5 mg total) by mouth daily before breakfast.  . [DISCONTINUED] acetaminophen (TYLENOL) 325 MG tablet Take 650 mg by mouth every 6 (six) hours as needed.  . [DISCONTINUED] benzonatate (TESSALON) 100 MG capsule Take 1 capsule (100 mg total) by mouth 2 (two) times daily as needed for cough.   No facility-administered encounter medications on file as of 09/01/2019.    Allergies: Patient has no known allergies.  Body mass index is 35.44 kg/m.  Blood pressure 121/78, pulse 67, temperature 97.9 F (36.6 C), temperature source Oral, height 5' 1.75" (1.568 m), weight 192 lb 3.2 oz (87.2 kg), last menstrual period 08/07/2019, not currently breastfeeding.     Review of Systems  Constitutional: Positive for fatigue. Negative for activity change, appetite change, chills, diaphoresis, fever and unexpected weight change.  HENT: Negative for congestion.   Eyes: Negative for visual disturbance.  Respiratory: Negative for cough, chest tightness,  shortness of breath, wheezing and stridor.   Cardiovascular: Negative for chest pain, palpitations and leg swelling.  Endocrine: Negative for polydipsia, polyphagia and polyuria.  Neurological: Negative for dizziness and headaches.  Hematological: Negative for adenopathy. Does not bruise/bleed easily.  Psychiatric/Behavioral: Negative for agitation, behavioral problems, confusion, decreased concentration, dysphoric mood, hallucinations, self-injury, sleep disturbance and suicidal ideas. The patient is not nervous/anxious and is not hyperactive.        Objective:    Physical Exam Vitals and nursing note reviewed.  Constitutional:      General: Diane Marshall is not in acute distress.    Appearance: Diane Marshall is obese. Diane Marshall is not ill-appearing, toxic-appearing or diaphoretic.  HENT:     Head: Normocephalic and atraumatic.  Eyes:     Extraocular Movements: Extraocular movements intact.     Conjunctiva/sclera: Conjunctivae normal.     Pupils: Pupils are equal, round, and reactive to light.  Cardiovascular:     Rate and Rhythm: Normal rate and regular rhythm.     Pulses: Normal pulses.     Heart sounds: Normal heart sounds. No murmur. No friction rub. No gallop.   Pulmonary:     Effort: Pulmonary effort is normal. No respiratory distress.     Breath sounds: No stridor. No wheezing, rhonchi or rales.  Chest:     Chest wall: No tenderness.  Skin:    General: Skin is warm and dry.     Capillary Refill: Capillary refill takes less than 2 seconds.  Neurological:     Mental Status: Diane Marshall is alert and oriented to person, place, and time.  Psychiatric:        Mood and Affect: Mood normal.        Behavior: Behavior normal.        Thought Content: Thought content normal.        Judgment: Judgment normal.       Assessment & Plan:   1. Weight gain   2. BMI 35.0-35.9,adult     BMI 35.0-35.9,adult Current wt 192 Body mass index is 35.44 kg/m.  PDMP reviewed- no rx's listed on 2 yr look back We will call you with lab results. Increase water intake, strive for at least 80 ounces/day.   Follow Heart Healthy diet Increase regular exercise.  Recommend at least 30 minutes daily, 5 days per week of walking,biking, swimming, YouTube/Pinterest workout videos. Start Phentermine once daily. Continue to social distance and wear a mask when in public. Follow-up in 4 weeks.    FOLLOW-UP:  Return in about 4 weeks (around 09/29/2019) for Regular Follow Up, Medical Weight Loss, Evaluate Medication Effectiveness.

## 2019-09-02 LAB — T3: T3, Total: 189 ng/dL — ABNORMAL HIGH (ref 71–180)

## 2019-09-02 LAB — T4, FREE: Free T4: 1.28 ng/dL (ref 0.93–1.60)

## 2019-09-02 LAB — TSH: TSH: 0.919 u[IU]/mL (ref 0.450–4.500)

## 2019-09-06 DIAGNOSIS — Z20822 Contact with and (suspected) exposure to covid-19: Secondary | ICD-10-CM | POA: Diagnosis not present

## 2019-09-06 DIAGNOSIS — J029 Acute pharyngitis, unspecified: Secondary | ICD-10-CM | POA: Diagnosis not present

## 2019-09-06 DIAGNOSIS — B349 Viral infection, unspecified: Secondary | ICD-10-CM | POA: Diagnosis not present

## 2019-09-29 ENCOUNTER — Telehealth: Payer: Self-pay | Admitting: Family Medicine

## 2019-09-29 ENCOUNTER — Ambulatory Visit: Payer: Medicaid Other | Admitting: Family Medicine

## 2019-09-29 ENCOUNTER — Other Ambulatory Visit: Payer: Self-pay

## 2019-09-29 NOTE — Telephone Encounter (Signed)
Patient had telehealth apt at 315pm. Attempted to call patient 3 times with no answer and unable to leave voicemail. AS< CMA

## 2020-07-20 DIAGNOSIS — U071 COVID-19: Secondary | ICD-10-CM | POA: Diagnosis not present

## 2021-03-11 DIAGNOSIS — R220 Localized swelling, mass and lump, head: Secondary | ICD-10-CM | POA: Diagnosis not present

## 2021-03-11 DIAGNOSIS — S0011XA Contusion of right eyelid and periocular area, initial encounter: Secondary | ICD-10-CM | POA: Diagnosis not present

## 2021-03-11 DIAGNOSIS — R519 Headache, unspecified: Secondary | ICD-10-CM | POA: Diagnosis not present

## 2021-03-11 DIAGNOSIS — S0511XA Contusion of eyeball and orbital tissues, right eye, initial encounter: Secondary | ICD-10-CM | POA: Diagnosis not present

## 2021-03-11 DIAGNOSIS — Z6838 Body mass index (BMI) 38.0-38.9, adult: Secondary | ICD-10-CM | POA: Diagnosis not present

## 2021-03-11 DIAGNOSIS — M7989 Other specified soft tissue disorders: Secondary | ICD-10-CM | POA: Diagnosis not present

## 2021-03-11 DIAGNOSIS — Z973 Presence of spectacles and contact lenses: Secondary | ICD-10-CM | POA: Diagnosis not present

## 2021-05-16 DIAGNOSIS — Z6838 Body mass index (BMI) 38.0-38.9, adult: Secondary | ICD-10-CM | POA: Diagnosis not present

## 2021-05-16 DIAGNOSIS — M7918 Myalgia, other site: Secondary | ICD-10-CM | POA: Diagnosis not present

## 2021-05-16 DIAGNOSIS — S59902A Unspecified injury of left elbow, initial encounter: Secondary | ICD-10-CM | POA: Diagnosis not present

## 2021-05-16 DIAGNOSIS — R200 Anesthesia of skin: Secondary | ICD-10-CM | POA: Diagnosis not present

## 2021-05-16 DIAGNOSIS — W172XXA Fall into hole, initial encounter: Secondary | ICD-10-CM | POA: Diagnosis not present

## 2021-05-16 DIAGNOSIS — M25522 Pain in left elbow: Secondary | ICD-10-CM | POA: Diagnosis not present

## 2021-07-11 ENCOUNTER — Ambulatory Visit: Payer: Medicaid Other | Admitting: Nurse Practitioner

## 2021-09-27 ENCOUNTER — Encounter: Payer: Self-pay | Admitting: Nurse Practitioner

## 2021-09-27 ENCOUNTER — Other Ambulatory Visit: Payer: Self-pay

## 2021-09-27 ENCOUNTER — Ambulatory Visit (INDEPENDENT_AMBULATORY_CARE_PROVIDER_SITE_OTHER): Payer: BC Managed Care – PPO | Admitting: Nurse Practitioner

## 2021-09-27 VITALS — BP 118/79 | HR 88 | Temp 98.1°F | Ht 61.81 in | Wt 225.2 lb

## 2021-09-27 DIAGNOSIS — Z6841 Body Mass Index (BMI) 40.0 and over, adult: Secondary | ICD-10-CM | POA: Diagnosis not present

## 2021-09-27 DIAGNOSIS — Z7689 Persons encountering health services in other specified circumstances: Secondary | ICD-10-CM | POA: Diagnosis not present

## 2021-09-27 DIAGNOSIS — R5383 Other fatigue: Secondary | ICD-10-CM

## 2021-09-27 NOTE — Patient Instructions (Signed)
Fat and Cholesterol Restricted Eating Plan Getting too much fat and cholesterol in your diet may cause health problems. Choosing the right foods helps keep your fat and cholesterol at normal levels. This can keep you from getting certain diseases. Your doctor may recommend an eating plan that includes: Total fat: ______% or less of total calories a day. This is ______g of fat a day. Saturated fat: ______% or less of total calories a day. This is ______g of saturated fat a day. Cholesterol: less than _________mg a day. Fiber: ______g a day. What are tips for following this plan? General tips Work with your doctor to lose weight if you need to. Avoid: Foods with added sugar. Fried foods. Foods with trans fat or partially hydrogenated oils. This includes some margarines and baked goods. If you drink alcohol: Limit how much you have to: 0-1 drink a day for women who are not pregnant. 0-2 drinks a day for men. Know how much alcohol is in a drink. In the [place] one drink equals one 12 oz bottle of beer (355 mL), one 5 oz glass of wine (148 mL), or one 1 oz glass of hard liquor (44 mL). Reading food labels Check food labels for: Trans fats. Partially hydrogenated oils. Saturated fat (g) in each serving. Cholesterol (mg) in each serving. Fiber (g) in each serving. Choose foods with healthy fats, such as: Monounsaturated fats and polyunsaturated fats. These include olive and canola oil, flaxseeds, walnuts, almonds, and seeds. Omega-3 fats. These are found in certain fish, flaxseed oil, and ground flaxseeds. Choose grain products that have whole grains. Look for the word "whole" as the first word in the ingredient list. Cooking Cook foods using low-fat methods. These include baking, boiling, grilling, and broiling. Eat more home-cooked foods. Eat at restaurants and buffets less often. Eat less fast food. Avoid cooking using saturated fats, such as butter, cream, palm oil, palm kernel oil, and  coconut oil. Meal planning  At meals, divide your plate into four equal parts: Fill one-half of your plate with vegetables, green salads, and fruit. Fill one-fourth of your plate with whole grains. Fill one-fourth of your plate with low-fat (lean) protein foods. Eat fish that is high in omega-3 fats at least two times a week. This includes mackerel, tuna, sardines, and salmon. Eat foods that are high in fiber, such as whole grains, beans, apples, pears, berries, broccoli, carrots, peas, and barley. What foods should I eat? Fruits All fresh, canned (in natural juice), or frozen fruits. Vegetables Fresh or frozen vegetables (raw, steamed, roasted, or grilled). Green salads. Grains Whole grains, such as whole wheat or whole grain breads, crackers, cereals, and pasta. Unsweetened oatmeal, bulgur, barley, quinoa, or brown rice. Corn or whole wheat flour tortillas. Meats and other protein foods Ground beef (85% or leaner), grass-fed beef, or beef trimmed of fat. Skinless chicken or turkey. Ground chicken or turkey. Pork trimmed of fat. All fish and seafood. Egg whites. Dried beans, peas, or lentils. Unsalted nuts or seeds. Unsalted canned beans. Nut butters without added sugar or oil. Dairy Low-fat or nonfat dairy products, such as skim or 1% milk, 2% or reduced-fat cheeses, low-fat and fat-free ricotta or cottage cheese, or plain low-fat and nonfat yogurt. Fats and oils Tub margarine without trans fats. Light or reduced-fat mayonnaise and salad dressings. Avocado. Olive, canola, sesame, or safflower oils. The items listed above may not be a complete list of foods and beverages you can eat. Contact a dietitian for more information. What foods   should I avoid? Fruits Canned fruit in heavy syrup. Fruit in cream or butter sauce. Fried fruit. Vegetables Vegetables cooked in cheese, cream, or butter sauce. Fried vegetables. Grains White bread. White pasta. White rice. Cornbread. Bagels, pastries,  and croissants. Crackers and snack foods that contain trans fat and hydrogenated oils. Meats and other protein foods Fatty cuts of meat. Ribs, chicken wings, bacon, sausage, bologna, salami, chitterlings, fatback, hot dogs, bratwurst, and packaged lunch meats. Liver and organ meats. Whole eggs and egg yolks. Chicken and turkey with skin. Fried meat. Dairy Whole or 2% milk, cream, half-and-half, and cream cheese. Whole milk cheeses. Whole-fat or sweetened yogurt. Full-fat cheeses. Nondairy creamers and whipped toppings. Processed cheese, cheese spreads, and cheese curds. Fats and oils Butter, stick margarine, lard, shortening, ghee, or bacon fat. Coconut, palm kernel, and palm oils. Beverages Alcohol. Sugar-sweetened drinks such as sodas, lemonade, and fruit drinks. Sweets and desserts Corn syrup, sugars, honey, and molasses. Candy. Jam and jelly. Syrup. Sweetened cereals. Cookies, pies, cakes, donuts, muffins, and ice cream. The items listed above may not be a complete list of foods and beverages you should avoid. Contact a dietitian for more information. Summary Choosing the right foods helps keep your fat and cholesterol at normal levels. This can keep you from getting certain diseases. At meals, fill one-half of your plate with vegetables, green salads, and fruits. Eat high fiber foods, like whole grains, beans, apples, pears, berries, carrots, peas, and barley. Limit added sugar, saturated fats, alcohol, and fried foods. This information is not intended to replace advice given to you by your health care provider. Make sure you discuss any questions you have with your health care provider. Document Revised: 11/17/2020 Document Reviewed: 11/17/2020 Elsevier Patient Education  2022 Elsevier Inc.  

## 2021-09-27 NOTE — Progress Notes (Signed)
? ?New Patient Office Visit ? ?Subjective:  ?Patient ID: Diane Marshall, female    DOB: October 26, 2000  Age: 21 y.o. MRN: [id number] ? ?CC:  ?Chief Complaint  ?Patient presents with  ? New Patient (Initial Visit)  ? ? ?HPI ?Diane Marshall presents to establish new primary care provider. She has aged out of pediatrician office. She was going to [facility]. She does have a GYN provider and will see them later this year.  ?-she is concerned about weight. Feels like she needs something to help her lose weight. She has already cut cut out soda and sugar. She has tried to increase her activity level. Has a two year old daughter who keeps her active. With lifestyle changes, she has lost a few pounds.  ?-fatigue.  ? ?Past Medical History:  ?Diagnosis Date  ? Abdominal pain   ? Encopresis   ? Loose stools   ? ? ?Past Surgical History:  ?Procedure Laterality Date  ? NO PAST SURGERIES    ? ? ?Family History  ?Problem Relation Age of Onset  ? Lactose intolerance Sister   ? Hypertension Sister   ? Hypertension Sister   ? Lactose intolerance Father   ? Hypertension Father   ? Cholelithiasis Paternal Grandmother   ? Cancer Paternal Grandmother   ?     colon  ? Hypertension Paternal Grandmother   ? Heart attack Maternal Grandfather   ? Celiac disease Neg Hx   ? Ulcers Neg Hx   ? Irritable bowel syndrome Neg Hx   ? ? ?Social History  ? ?Socioeconomic History  ? Marital status: Single  ?  Spouse name: Not on file  ? Number of children: Not on file  ? Years of education: Not on file  ? Highest education level: Not on file  ?Occupational History  ? Not on file  ?Tobacco Use  ? Smoking status: Never  ? Smokeless tobacco: Never  ?Vaping Use  ? Vaping Use: Never used  ?Substance and Sexual Activity  ? Alcohol use: Never  ? Drug use: Never  ? Sexual activity: Yes  ?Other Topics Concern  ? Not on file  ?Social History Narrative  ? 8th grade 2014-2015  ? ?Social Determinants of Health  ? ?Financial Resource Strain: Not on file  ?Food  Insecurity: Not on file  ?Transportation Needs: Not on file  ?Physical Activity: Not on file  ?Stress: Not on file  ?Social Connections: Not on file  ?Intimate Partner Violence: Not on file  ? ? ?ROS ?Review of Systems  ?Constitutional:  Positive for fatigue. Negative for activity change, appetite change, chills and fever.  ?     Difficulty weight losing weight after having two year old daughter.   ?HENT:  Negative for congestion, postnasal drip, rhinorrhea, sinus pressure, sinus pain, sneezing and sore throat.   ?Eyes: Negative.   ?Respiratory:  Negative for cough, chest tightness, shortness of breath and wheezing.   ?Cardiovascular:  Negative for chest pain and palpitations.  ?Gastrointestinal:  Negative for abdominal pain, constipation, diarrhea, nausea and vomiting.  ?Endocrine: Negative for cold intolerance, heat intolerance, polydipsia and polyuria.  ?Genitourinary:  Negative for dyspareunia, dysuria, flank pain, frequency and urgency.  ?Musculoskeletal:  Negative for arthralgias, back pain and myalgias.  ?Skin:  Negative for rash.  ?Allergic/Immunologic: Negative for environmental allergies.  ?Neurological:  Negative for dizziness, weakness and headaches.  ?Hematological:  Negative for adenopathy.  ?Psychiatric/Behavioral:  The patient is not nervous/anxious.   ? ?Objective:  ? ?Today's  Vitals  ? 09/27/21 1356  ?BP: 118/79  ?Pulse: 88  ?Temp: 98.1 ?F (36.7 ?C)  ?SpO2: 97%  ?Weight: 225 lb 3.2 oz (102.2 kg)  ?Height: 5' 1.81" (1.57 m)  ? ?Body mass index is 41.44 kg/m?.  ? ?Physical Exam ?Vitals and nursing note reviewed.  ?Constitutional:   ?   Appearance: Normal appearance. She is well-developed. She is obese.  ?HENT:  ?   Head: Normocephalic and atraumatic.  ?Eyes:  ?   Pupils: Pupils are equal, round, and reactive to light.  ?Cardiovascular:  ?   Rate and Rhythm: Normal rate and regular rhythm.  ?   Pulses: Normal pulses.  ?   Heart sounds: Normal heart sounds.  ?Pulmonary:  ?   Effort: Pulmonary effort is  normal.  ?   Breath sounds: Normal breath sounds.  ?Abdominal:  ?   Palpations: Abdomen is soft.  ?Musculoskeletal:     ?   General: Normal range of motion.  ?   Cervical back: Normal range of motion and neck supple.  ?Lymphadenopathy:  ?   Cervical: No cervical adenopathy.  ?Skin: ?   General: Skin is warm and dry.  ?   Capillary Refill: Capillary refill takes less than 2 seconds.  ?Neurological:  ?   General: No focal deficit present.  ?   Mental Status: She is alert and oriented to person, place, and time.  ?Psychiatric:     ?   Mood and Affect: Mood normal.     ?   Behavior: Behavior normal.     ?   Thought Content: Thought content normal.     ?   Judgment: Judgment normal.  ? ? ?Assessment & Plan:  ?1. Other fatigue ?Will check labs in one week. Check fasting lipids, thyroid panel, HgbA1c, and vitamin d. ? ?2. Body mass index (BMI) of 40.1-44.9 in adult [facility]) ?Discussed lowering calorie intake to 1500 calories per day and incorporating exercise into daily routine to help lose weight. Will discuss different medication options for help with weight management at next visit in 2 weeks.  ? ?3. Encounter to establish care ?Appointment today to establish new primary care provider   ? ?  ?Problem List Items Addressed This Visit   ? ?  ? Other  ? Body mass index (BMI) of 40.1-44.9 in adult [facility]) - Primary  ? Other fatigue  ? ?Other Visit Diagnoses   ? ? Encounter to establish care      ? ?  ? ? ?Outpatient Encounter Medications as of 09/27/2021  ?Medication Sig  ? etonogestrel-ethinyl estradiol (NUVARING) 0.12-0.015 MG/24HR vaginal ring   ? [DISCONTINUED] phentermine (ADIPEX-P) 37.5 MG tablet Take 1 tablet (37.5 mg total) by mouth daily before breakfast.  ? ?No facility-administered encounter medications on file as of 09/27/2021.  ? ? ?Follow-up: Return in about 2 weeks (around 10/11/2021), or discuss labs and weight management medication, for FBW a week prior to visit, add free T4 and vitamin d. .  ? ?Carlean Jews,  NP ? ?

## 2021-10-01 ENCOUNTER — Other Ambulatory Visit: Payer: Self-pay

## 2021-10-01 DIAGNOSIS — R5383 Other fatigue: Secondary | ICD-10-CM

## 2021-10-01 DIAGNOSIS — E569 Vitamin deficiency, unspecified: Secondary | ICD-10-CM

## 2021-10-01 DIAGNOSIS — Z0000 Encounter for general adult medical examination without abnormal findings: Secondary | ICD-10-CM

## 2021-10-04 ENCOUNTER — Other Ambulatory Visit: Payer: Self-pay

## 2021-10-04 ENCOUNTER — Other Ambulatory Visit: Payer: BC Managed Care – PPO

## 2021-10-04 DIAGNOSIS — E569 Vitamin deficiency, unspecified: Secondary | ICD-10-CM | POA: Diagnosis not present

## 2021-10-04 DIAGNOSIS — Z0000 Encounter for general adult medical examination without abnormal findings: Secondary | ICD-10-CM | POA: Diagnosis not present

## 2021-10-04 DIAGNOSIS — R5383 Other fatigue: Secondary | ICD-10-CM | POA: Diagnosis not present

## 2021-10-05 LAB — CBC
Hematocrit: 43.5 % (ref 34.0–46.6)
Hemoglobin: 14.5 g/dL (ref 11.1–15.9)
MCH: 27.5 pg (ref 26.6–33.0)
MCHC: 33.3 g/dL (ref 31.5–35.7)
MCV: 82 fL (ref 79–97)
Platelets: 340 10*3/uL (ref 150–450)
RBC: 5.28 x10E6/uL (ref 3.77–5.28)
RDW: 12.1 % (ref 11.7–15.4)
WBC: 6.8 10*3/uL (ref 3.4–10.8)

## 2021-10-05 LAB — LIPID PANEL
Chol/HDL Ratio: 4.1 ratio (ref 0.0–4.4)
Cholesterol, Total: 213 mg/dL — ABNORMAL HIGH (ref 100–199)
HDL: 52 mg/dL (ref >39)
LDL Chol Calc (NIH): 136 mg/dL — ABNORMAL HIGH (ref 0–99)
Triglycerides: 139 mg/dL (ref 0–149)
VLDL Cholesterol Cal: 25 mg/dL (ref 5–40)

## 2021-10-05 LAB — VITAMIN D 25 HYDROXY (VIT D DEFICIENCY, FRACTURES): Vit D, 25-Hydroxy: 15.6 ng/mL — ABNORMAL LOW (ref 30.0–100.0)

## 2021-10-05 LAB — COMPREHENSIVE METABOLIC PANEL
ALT: 12 IU/L (ref 0–32)
AST: 18 IU/L (ref 0–40)
Albumin/Globulin Ratio: 1.2 (ref 1.2–2.2)
Albumin: 4.1 g/dL (ref 3.9–5.0)
Alkaline Phosphatase: 73 IU/L (ref 44–121)
BUN/Creatinine Ratio: 11 (ref 9–23)
BUN: 8 mg/dL (ref 6–20)
Bilirubin Total: 0.4 mg/dL (ref 0.0–1.2)
CO2: 23 mmol/L (ref 20–29)
Calcium: 9.6 mg/dL (ref 8.7–10.2)
Chloride: 100 mmol/L (ref 96–106)
Creatinine, Ser: 0.76 mg/dL (ref 0.57–1.00)
Globulin, Total: 3.3 g/dL (ref 1.5–4.5)
Glucose: 86 mg/dL (ref 70–99)
Potassium: 4.4 mmol/L (ref 3.5–5.2)
Sodium: 136 mmol/L (ref 134–144)
Total Protein: 7.4 g/dL (ref 6.0–8.5)
eGFR: 114 mL/min/{1.73_m2} (ref >59)

## 2021-10-05 LAB — HEMOGLOBIN A1C
Est. average glucose Bld gHb Est-mCnc: 103 mg/dL
Hgb A1c MFr Bld: 5.2 % (ref 4.8–5.6)

## 2021-10-05 LAB — TSH: TSH: 1.85 u[IU]/mL (ref 0.450–4.500)

## 2021-10-05 LAB — T4, FREE: Free T4: 1.32 ng/dL (ref 0.82–1.77)

## 2021-10-05 NOTE — Progress Notes (Signed)
Vitamin d deficiency. Treat with Drisdol. Discuss with patient at visit 10/11/2021.

## 2021-10-11 ENCOUNTER — Ambulatory Visit (INDEPENDENT_AMBULATORY_CARE_PROVIDER_SITE_OTHER): Payer: BC Managed Care – PPO | Admitting: Nurse Practitioner

## 2021-10-11 ENCOUNTER — Other Ambulatory Visit: Payer: Self-pay

## 2021-10-11 ENCOUNTER — Encounter: Payer: Self-pay | Admitting: Nurse Practitioner

## 2021-10-11 VITALS — BP 105/72 | HR 97 | Temp 98.1°F | Ht 61.81 in | Wt 223.3 lb

## 2021-10-11 DIAGNOSIS — E785 Hyperlipidemia, unspecified: Secondary | ICD-10-CM | POA: Diagnosis not present

## 2021-10-11 DIAGNOSIS — E559 Vitamin D deficiency, unspecified: Secondary | ICD-10-CM | POA: Diagnosis not present

## 2021-10-11 DIAGNOSIS — Z6841 Body Mass Index (BMI) 40.0 and over, adult: Secondary | ICD-10-CM

## 2021-10-11 DIAGNOSIS — H6690 Otitis media, unspecified, unspecified ear: Secondary | ICD-10-CM | POA: Insufficient documentation

## 2021-10-11 MED ORDER — ERGOCALCIFEROL 1.25 MG (50000 UT) PO CAPS: 50000.0000 [IU] | ORAL_CAPSULE | ORAL | 5 refills | Status: DC

## 2021-10-11 MED ORDER — CIPROFLOXACIN-DEXAMETHASONE 0.3-0.1 % OT SUSP: 4.0000 [drp] | Freq: Two times a day (BID) | OTIC | 0 refills | Status: DC

## 2021-10-11 MED ORDER — PHENTERMINE HCL 37.5 MG PO TABS: 37.5000 mg | ORAL_TABLET | Freq: Every day | ORAL | 0 refills | Status: DC

## 2021-10-11 NOTE — Progress Notes (Signed)
Established patient visit ? ? ?Patient: Diane Marshall   DOB: Jan 06, 2001   20 y.o. Female  MRN: [id number] ?Visit Date: 10/11/2021 ? ? ?Chief Complaint  ?Patient presents with  ? Follow-up  ? Weight Check  ? ?Subjective  ?  ?HPI  ?The patient is here for routine follow up visit.  ?-labs done since last visit ? -mild elevation of LDL and total cholesterol  ? -vitamin d deficiency ?-concern about weight. Has made some lifestyle changes since her initial visit.is limiting calorie intake to 1500 calories per day. She is getting outside more often to be physically active. Has lost two pounds since she was last seen.  ?-ears feel full and clogged. Having trouble hearing, especially out of right ear. She does have TP tubes which are permanent. She has ENT provider and states that she has appointment with them 11/05/2021.  ? ? ?Medications: ?Outpatient Medications Prior to Visit  ?Medication Sig  ? etonogestrel-ethinyl estradiol (NUVARING) 0.12-0.015 MG/24HR vaginal ring   ? ?No facility-administered medications prior to visit.  ? ? ?Review of Systems  ?Constitutional:  Negative for activity change, appetite change, chills, fatigue and fever.  ?HENT:  Positive for ear pain. Negative for congestion, postnasal drip, rhinorrhea, sinus pressure, sinus pain, sneezing and sore throat.   ?     Ears feel clogged and difficult to hear   ?Eyes: Negative.   ?Respiratory:  Negative for cough, chest tightness, shortness of breath and wheezing.   ?Cardiovascular:  Negative for chest pain and palpitations.  ?Gastrointestinal:  Negative for abdominal pain, constipation, diarrhea, nausea and vomiting.  ?Endocrine: Negative for cold intolerance, heat intolerance, polydipsia and polyuria.  ?Genitourinary:  Negative for dyspareunia, dysuria, flank pain, frequency and urgency.  ?Musculoskeletal:  Negative for arthralgias, back pain and myalgias.  ?Skin:  Negative for rash.  ?Allergic/Immunologic: Negative for environmental allergies.   ?Neurological:  Negative for dizziness, weakness and headaches.  ?Hematological:  Negative for adenopathy.  ?Psychiatric/Behavioral:  The patient is not nervous/anxious.   ? ?Last CBC ?Lab Results  ?Component Value Date  ? WBC 6.8 10/04/2021  ? HGB 14.5 10/04/2021  ? HCT 43.5 10/04/2021  ? MCV 82 10/04/2021  ? MCH 27.5 10/04/2021  ? RDW 12.1 10/04/2021  ? PLT 340 10/04/2021  ? ?Last metabolic panel ?Lab Results  ?Component Value Date  ? GLUCOSE 86 10/04/2021  ? NA 136 10/04/2021  ? K 4.4 10/04/2021  ? CL 100 10/04/2021  ? CO2 23 10/04/2021  ? BUN 8 10/04/2021  ? CREATININE 0.76 10/04/2021  ? EGFR 114 10/04/2021  ? CALCIUM 9.6 10/04/2021  ? PROT 7.4 10/04/2021  ? ALBUMIN 4.1 10/04/2021  ? LABGLOB 3.3 10/04/2021  ? AGRATIO 1.2 10/04/2021  ? BILITOT 0.4 10/04/2021  ? ALKPHOS 73 10/04/2021  ? AST 18 10/04/2021  ? ALT 12 10/04/2021  ? ?Last lipids ?Lab Results  ?Component Value Date  ? CHOL 213 (H) 10/04/2021  ? HDL 52 10/04/2021  ? LDLCALC 136 (H) 10/04/2021  ? TRIG 139 10/04/2021  ? CHOLHDL 4.1 10/04/2021  ? ?Last hemoglobin A1c ?Lab Results  ?Component Value Date  ? HGBA1C 5.2 10/04/2021  ? ?Last thyroid functions ?Lab Results  ?Component Value Date  ? TSH 1.850 10/04/2021  ? T3TOTAL 189 (H) 09/01/2019  ? ?Last vitamin D ?Lab Results  ?Component Value Date  ? VD25OH 15.6 (L) 10/04/2021  ? ?  ? ? Objective  ?  ? ?Today's Vitals  ? 10/11/21 1515  ?BP: 105/72  ?Pulse: 97  ?  Temp: 98.1 ?F (36.7 ?C)  ?Weight: 223 lb 4.8 oz (101.3 kg)  ?Height: 5' 1.81" (1.57 m)  ? ?Body mass index is 41.09 kg/m?.  ? ?BP Readings from Last 3 Encounters:  ?10/11/21 105/72  ?09/27/21 118/79  ?09/01/19 121/78  ?  ?Wt Readings from Last 3 Encounters:  ?10/11/21 223 lb 4.8 oz (101.3 kg)  ?09/27/21 225 lb 3.2 oz (102.2 kg)  ?09/01/19 192 lb 3.2 oz (87.2 kg) (97 %, Z= 1.82)*  ? ?* Growth percentiles are based on CDC (Girls, 2-20 Years) data.  ?  ?Physical Exam ?Vitals and nursing note reviewed.  ?Constitutional:   ?   Appearance: Normal  appearance. She is well-developed. She is obese.  ?HENT:  ?   Head: Normocephalic and atraumatic.  ?   Right Ear: Decreased hearing noted. Tympanic membrane is erythematous and bulging.  ?   Left Ear: Tympanic membrane, ear canal and external ear normal. Decreased hearing noted.  ?Eyes:  ?   Pupils: Pupils are equal, round, and reactive to light.  ?Cardiovascular:  ?   Rate and Rhythm: Normal rate and regular rhythm.  ?   Pulses: Normal pulses.  ?   Heart sounds: Normal heart sounds.  ?Pulmonary:  ?   Effort: Pulmonary effort is normal.  ?   Breath sounds: Normal breath sounds.  ?Abdominal:  ?   Palpations: Abdomen is soft.  ?Musculoskeletal:     ?   General: Normal range of motion.  ?   Cervical back: Normal range of motion and neck supple.  ?Lymphadenopathy:  ?   Cervical: No cervical adenopathy.  ?Skin: ?   General: Skin is warm and dry.  ?   Capillary Refill: Capillary refill takes less than 2 seconds.  ?Neurological:  ?   General: No focal deficit present.  ?   Mental Status: She is alert and oriented to person, place, and time.  ?Psychiatric:     ?   Mood and Affect: Mood normal.     ?   Behavior: Behavior normal.     ?   Thought Content: Thought content normal.     ?   Judgment: Judgment normal.  ?  ? ? Assessment & Plan  ?  ?1. Chronic otitis media, unspecified otitis media type ?Right ear canal pink and very full. Tender. Start ciprodex ear drops. Place four drops into right ear twice daily for 7 days. Recommend use of tylenol as needed for pain. Advised she keep current appointment with ENT.  ?- ciprofloxacin-dexamethasone (CIPRODEX) OTIC suspension; Place 4 drops into the right ear 2 (two) times daily.  Dispense: 7.5 mL; Refill: 0 ? ?2. Vitamin D deficiency ?Treat with Drisdol weekly for next several months.  ?- ergocalciferol (DRISDOL) 1.25 MG (50000 UT) capsule; Take 1 capsule (50,000 Units total) by mouth once a week.  Dispense: 4 capsule; Refill: 5 ? ?3. Hyperlipidemia with target LDL less than  100 ?Recommend patient limit intake of fried and fatty foods. She should increase intake of lean proteins and green leafy vegetables. Adding exercise into daily routine will also be beneficial.   ? ?4. Body mass index (BMI) of 40.1-44.9 in adult [facility]) ?Start phentermine 37.5 mg daily. Recommend she continue keeping calorie intake to 1500 calories per day and incorporating exercise into daily routine to help lose weight. Follow up in one month.  ?- phentermine (ADIPEX-P) 37.5 MG tablet; Take 1 tablet (37.5 mg total) by mouth daily before breakfast.  Dispense: 30 tablet; Refill: 0  ? ? ?  Problem List Items Addressed This Visit   ? ?  ? Nervous and Auditory  ? Chronic otitis media - Primary  ? Relevant Medications  ? ciprofloxacin-dexamethasone (CIPRODEX) OTIC suspension  ?  ? Other  ? Body mass index (BMI) of 40.1-44.9 in adult [facility])  ? Relevant Medications  ? phentermine (ADIPEX-P) 37.5 MG tablet  ? Vitamin D deficiency  ? Relevant Medications  ? ergocalciferol (DRISDOL) 1.25 MG (50000 UT) capsule  ? Hyperlipidemia with target LDL less than 100  ?  ? ?Return in about 4 weeks (around 11/08/2021) for routine - weight management.  ?   ? ? ? ? ?Ronnell Freshwater, NP  ?[facility] ?[phone] (phone) ?[phone] (fax) ? ?[facility]  ?

## 2021-10-11 NOTE — Patient Instructions (Signed)
Fat and Cholesterol Restricted Eating Plan Getting too much fat and cholesterol in your diet may cause health problems. Choosing the right foods helps keep your fat and cholesterol at normal levels. This can keep you from getting certain diseases. Your doctor may recommend an eating plan that includes: Total fat: ______% or less of total calories a day. This is ______g of fat a day. Saturated fat: ______% or less of total calories a day. This is ______g of saturated fat a day. Cholesterol: less than _________mg a day. Fiber: ______g a day. What are tips for following this plan? General tips Work with your doctor to lose weight if you need to. Avoid: Foods with added sugar. Fried foods. Foods with trans fat or partially hydrogenated oils. This includes some margarines and baked goods. If you drink alcohol: Limit how much you have to: 0-1 drink a day for women who are not pregnant. 0-2 drinks a day for men. Know how much alcohol is in a drink. In the [place] one drink equals one 12 oz bottle of beer (355 mL), one 5 oz glass of wine (148 mL), or one 1 oz glass of hard liquor (44 mL). Reading food labels Check food labels for: Trans fats. Partially hydrogenated oils. Saturated fat (g) in each serving. Cholesterol (mg) in each serving. Fiber (g) in each serving. Choose foods with healthy fats, such as: Monounsaturated fats and polyunsaturated fats. These include olive and canola oil, flaxseeds, walnuts, almonds, and seeds. Omega-3 fats. These are found in certain fish, flaxseed oil, and ground flaxseeds. Choose grain products that have whole grains. Look for the word "whole" as the first word in the ingredient list. Cooking Cook foods using low-fat methods. These include baking, boiling, grilling, and broiling. Eat more home-cooked foods. Eat at restaurants and buffets less often. Eat less fast food. Avoid cooking using saturated fats, such as butter, cream, palm oil, palm kernel oil, and  coconut oil. Meal planning  At meals, divide your plate into four equal parts: Fill one-half of your plate with vegetables, green salads, and fruit. Fill one-fourth of your plate with whole grains. Fill one-fourth of your plate with low-fat (lean) protein foods. Eat fish that is high in omega-3 fats at least two times a week. This includes mackerel, tuna, sardines, and salmon. Eat foods that are high in fiber, such as whole grains, beans, apples, pears, berries, broccoli, carrots, peas, and barley. What foods should I eat? Fruits All fresh, canned (in natural juice), or frozen fruits. Vegetables Fresh or frozen vegetables (raw, steamed, roasted, or grilled). Green salads. Grains Whole grains, such as whole wheat or whole grain breads, crackers, cereals, and pasta. Unsweetened oatmeal, bulgur, barley, quinoa, or brown rice. Corn or whole wheat flour tortillas. Meats and other protein foods Ground beef (85% or leaner), grass-fed beef, or beef trimmed of fat. Skinless chicken or turkey. Ground chicken or turkey. Pork trimmed of fat. All fish and seafood. Egg whites. Dried beans, peas, or lentils. Unsalted nuts or seeds. Unsalted canned beans. Nut butters without added sugar or oil. Dairy Low-fat or nonfat dairy products, such as skim or 1% milk, 2% or reduced-fat cheeses, low-fat and fat-free ricotta or cottage cheese, or plain low-fat and nonfat yogurt. Fats and oils Tub margarine without trans fats. Light or reduced-fat mayonnaise and salad dressings. Avocado. Olive, canola, sesame, or safflower oils. The items listed above may not be a complete list of foods and beverages you can eat. Contact a dietitian for more information. What foods   should I avoid? Fruits Canned fruit in heavy syrup. Fruit in cream or butter sauce. Fried fruit. Vegetables Vegetables cooked in cheese, cream, or butter sauce. Fried vegetables. Grains White bread. White pasta. White rice. Cornbread. Bagels, pastries,  and croissants. Crackers and snack foods that contain trans fat and hydrogenated oils. Meats and other protein foods Fatty cuts of meat. Ribs, chicken wings, bacon, sausage, bologna, salami, chitterlings, fatback, hot dogs, bratwurst, and packaged lunch meats. Liver and organ meats. Whole eggs and egg yolks. Chicken and turkey with skin. Fried meat. Dairy Whole or 2% milk, cream, half-and-half, and cream cheese. Whole milk cheeses. Whole-fat or sweetened yogurt. Full-fat cheeses. Nondairy creamers and whipped toppings. Processed cheese, cheese spreads, and cheese curds. Fats and oils Butter, stick margarine, lard, shortening, ghee, or bacon fat. Coconut, palm kernel, and palm oils. Beverages Alcohol. Sugar-sweetened drinks such as sodas, lemonade, and fruit drinks. Sweets and desserts Corn syrup, sugars, honey, and molasses. Candy. Jam and jelly. Syrup. Sweetened cereals. Cookies, pies, cakes, donuts, muffins, and ice cream. The items listed above may not be a complete list of foods and beverages you should avoid. Contact a dietitian for more information. Summary Choosing the right foods helps keep your fat and cholesterol at normal levels. This can keep you from getting certain diseases. At meals, fill one-half of your plate with vegetables, green salads, and fruits. Eat high fiber foods, like whole grains, beans, apples, pears, berries, carrots, peas, and barley. Limit added sugar, saturated fats, alcohol, and fried foods. This information is not intended to replace advice given to you by your health care provider. Make sure you discuss any questions you have with your health care provider. Document Revised: 11/17/2020 Document Reviewed: 11/17/2020 Elsevier Patient Education  2022 Elsevier Inc.  

## 2021-10-19 DIAGNOSIS — Z6841 Body Mass Index (BMI) 40.0 and over, adult: Secondary | ICD-10-CM | POA: Diagnosis not present

## 2021-10-19 DIAGNOSIS — I10 Essential (primary) hypertension: Secondary | ICD-10-CM | POA: Diagnosis not present

## 2021-10-19 DIAGNOSIS — G43109 Migraine with aura, not intractable, without status migrainosus: Secondary | ICD-10-CM | POA: Diagnosis not present

## 2021-10-19 DIAGNOSIS — R519 Headache, unspecified: Secondary | ICD-10-CM | POA: Diagnosis not present

## 2021-10-24 ENCOUNTER — Ambulatory Visit (INDEPENDENT_AMBULATORY_CARE_PROVIDER_SITE_OTHER): Payer: BC Managed Care – PPO | Admitting: Nurse Practitioner

## 2021-10-24 ENCOUNTER — Encounter: Payer: Self-pay | Admitting: Nurse Practitioner

## 2021-10-24 VITALS — BP 123/76 | HR 88 | Temp 98.0°F | Ht 61.81 in | Wt 221.5 lb

## 2021-10-24 DIAGNOSIS — G43109 Migraine with aura, not intractable, without status migrainosus: Secondary | ICD-10-CM

## 2021-10-24 DIAGNOSIS — Z6841 Body Mass Index (BMI) 40.0 and over, adult: Secondary | ICD-10-CM | POA: Diagnosis not present

## 2021-10-24 MED ORDER — NURTEC 75 MG PO TBDP: ORAL_TABLET | ORAL | 0 refills | Status: DC

## 2021-10-24 MED ORDER — TOPIRAMATE 25 MG PO TABS: 25.0000 mg | ORAL_TABLET | Freq: Two times a day (BID) | ORAL | 1 refills | Status: DC

## 2021-10-24 NOTE — Progress Notes (Signed)
Established patient visit ? ? ?Patient: Diane Marshall   DOB: 07/13/2001   21 y.o. Female  MRN: [id number] ?Visit Date: 10/24/2021 ? ? ?Chief Complaint  ?Patient presents with  ? Medication Problem  ? ?Subjective  ?  ?HPI  ?The patient is here for follow up. ?-had to stop taking phentermine. Had rapid heart rate, high blood pressure, and headache.  ?-has daily headaches. Some of these are migraines. Some of these are tension headaches. She states that some of her migraines do have aura which preempts them. Aura consists of flashing lights and blurred vision.  ?-generally takes advil to relieve the headaches. Takes this every other day and sometimes every day.  ?-has had a two pound weight loss since last visit  ? ? ? ?Medications: ?Outpatient Medications Prior to Visit  ?Medication Sig  ? ciprofloxacin-dexamethasone (CIPRODEX) OTIC suspension Place 4 drops into the right ear 2 (two) times daily.  ? ergocalciferol (DRISDOL) 1.25 MG (50000 UT) capsule Take 1 capsule (50,000 Units total) by mouth once a week.  ? etonogestrel-ethinyl estradiol (NUVARING) 0.12-0.015 MG/24HR vaginal ring   ? phentermine (ADIPEX-P) 37.5 MG tablet Take 1 tablet (37.5 mg total) by mouth daily before breakfast. (Patient not taking: Reported on 10/24/2021)  ? ?No facility-administered medications prior to visit.  ? ? ?Review of Systems  ?Constitutional:  Negative for activity change, appetite change, chills, fatigue and fever.  ?HENT:  Negative for congestion, postnasal drip, rhinorrhea, sinus pressure, sinus pain, sneezing and sore throat.   ?Eyes: Negative.   ?Respiratory:  Negative for cough, chest tightness, shortness of breath and wheezing.   ?Cardiovascular:  Negative for chest pain and palpitations.  ?     Episode of palpitations and high blood pressure which caused her to go to ER to be seen. This has resolved.   ?Gastrointestinal:  Negative for abdominal pain, constipation, diarrhea, nausea and vomiting.  ?Endocrine: Negative for cold  intolerance, heat intolerance, polydipsia and polyuria.  ?Genitourinary:  Negative for dyspareunia, dysuria, flank pain, frequency and urgency.  ?Musculoskeletal:  Negative for arthralgias, back pain and myalgias.  ?Skin:  Negative for rash.  ?Allergic/Immunologic: Negative for environmental allergies.  ?Neurological:  Positive for headaches. Negative for dizziness and weakness.  ?     States that he is having headaches nearly every day. States that sometimes she does get aura with flashing lights and blurred visions along with her headaches.   ?Hematological:  Negative for adenopathy.  ?Psychiatric/Behavioral:  The patient is not nervous/anxious.   ? ? ? Objective  ?  ? ?Today's Vitals  ? 10/24/21 0943  ?BP: 123/76  ?Pulse: 88  ?Temp: 98 ?F (36.7 ?C)  ?SpO2: 99%  ?Weight: 221 lb 8 oz (100.5 kg)  ?Height: 5' 1.81" (1.57 m)  ? ?Body mass index is 40.76 kg/m?.  ? ?BP Readings from Last 3 Encounters:  ?10/24/21 123/76  ?10/11/21 105/72  ?09/27/21 118/79  ?  ?Wt Readings from Last 3 Encounters:  ?10/24/21 221 lb 8 oz (100.5 kg)  ?10/11/21 223 lb 4.8 oz (101.3 kg)  ?09/27/21 225 lb 3.2 oz (102.2 kg)  ?  ?Physical Exam ?Vitals and nursing note reviewed.  ?Constitutional:   ?   Appearance: Normal appearance. She is well-developed. She is obese.  ?HENT:  ?   Head: Normocephalic and atraumatic.  ?Eyes:  ?   Pupils: Pupils are equal, round, and reactive to light.  ?Cardiovascular:  ?   Rate and Rhythm: Normal rate and regular rhythm.  ?  Pulses: Normal pulses.  ?   Heart sounds: Normal heart sounds.  ?Pulmonary:  ?   Effort: Pulmonary effort is normal.  ?   Breath sounds: Normal breath sounds.  ?Abdominal:  ?   Palpations: Abdomen is soft.  ?Musculoskeletal:     ?   General: Normal range of motion.  ?   Cervical back: Normal range of motion and neck supple.  ?Lymphadenopathy:  ?   Cervical: No cervical adenopathy.  ?Skin: ?   General: Skin is warm and dry.  ?   Capillary Refill: Capillary refill takes less than 2 seconds.   ?Neurological:  ?   General: No focal deficit present.  ?   Mental Status: She is alert and oriented to person, place, and time.  ?Psychiatric:     ?   Mood and Affect: Mood normal.     ?   Behavior: Behavior normal.     ?   Thought Content: Thought content normal.     ?   Judgment: Judgment normal.  ?  ? ? Assessment & Plan  ?  ?1. Chronic migraine with aura ?Trial of topamax. Will have her start with 25mg  at bedtime. If tolerating well, may increase this to twice daily. Samples of nurtec were provided. She may take one tablet as needed for migraine headache. Advised her not to exceed one tablet in 24 hours. She voiced understanding and agreement with instructions . ?- topiramate (TOPAMAX) 25 MG tablet; Take 1 tablet (25 mg total) by mouth 2 (two) times daily. Take 1 tablet po QHS for 7 days then increase to twice daily as tolerated .  Dispense: 60 tablet; Refill: 1 ?- Rimegepant Sulfate (NURTEC) 75 MG TBDP; Take 1 tablet by mouth x 1 dose for acute headache. (Max 75 mg/day)  Dispense: 10 tablet; Refill: 0 ? ?2. Body mass index (BMI) of 40.1-44.9 in adult [facility]) ?Had to stop taking phentermine because high heart rate, blood pressure, and severe migraine ensued and caused her to have ER visit. Stopped this medication then. Trial of topamax for headaches may also help decrease her appetite and help with weight loss. Encouraged a 1500 calorie diet and increased exercise. Will follow up in one month for further evaluation.  ? ?  ?Problem List Items Addressed This Visit   ? ?  ? Cardiovascular and Mediastinum  ? Chronic migraine with aura - Primary  ? Relevant Medications  ? topiramate (TOPAMAX) 25 MG tablet  ? Rimegepant Sulfate (NURTEC) 75 MG TBDP  ?  ? Other  ? Body mass index (BMI) of 40.1-44.9 in adult [facility])  ?  ? ?Return in 4 weeks (on 11/21/2021) for migraines and weight loss - ok to cancel appointment for 11/08/2021.  ?   ? ? ? ? ?11/10/2021, NP  ?[facility] ?[phone]  (phone) ?[phone] (fax) ? ?[facility]  ? ?Medication Samples have been provided to the patient. ? ?Drug name: nurtec       Strength: 75 mg        Qty: 2 boxes  LOT: [id number]  Exp.Date: 08/2023 ? ?Dosing instructions: take 1 tablet po prn migraine headache. Not to exceed 1 tablet in 24 hours   ? ?The patient has been instructed regarding the correct time, dose, and frequency of taking this medication, including desired effects and most common side effects.  ? ?09/2023 ?10:19 AM ?10/24/2021  ?

## 2021-11-08 ENCOUNTER — Ambulatory Visit: Payer: BC Managed Care – PPO | Admitting: Nurse Practitioner

## 2021-11-15 ENCOUNTER — Other Ambulatory Visit: Payer: Self-pay | Admitting: Nurse Practitioner

## 2021-11-15 DIAGNOSIS — G43109 Migraine with aura, not intractable, without status migrainosus: Secondary | ICD-10-CM

## 2021-11-21 ENCOUNTER — Ambulatory Visit: Payer: BC Managed Care – PPO | Admitting: Nurse Practitioner

## 2021-11-22 ENCOUNTER — Ambulatory Visit (INDEPENDENT_AMBULATORY_CARE_PROVIDER_SITE_OTHER): Payer: BC Managed Care – PPO | Admitting: Nurse Practitioner

## 2021-11-22 ENCOUNTER — Encounter: Payer: Self-pay | Admitting: Nurse Practitioner

## 2021-11-22 VITALS — BP 108/70 | HR 97 | Temp 98.4°F | Ht 61.81 in | Wt 222.1 lb

## 2021-11-22 DIAGNOSIS — Z6841 Body Mass Index (BMI) 40.0 and over, adult: Secondary | ICD-10-CM

## 2021-11-22 DIAGNOSIS — M542 Cervicalgia: Secondary | ICD-10-CM

## 2021-11-22 DIAGNOSIS — G43909 Migraine, unspecified, not intractable, without status migrainosus: Secondary | ICD-10-CM | POA: Diagnosis not present

## 2021-11-22 DIAGNOSIS — G43109 Migraine with aura, not intractable, without status migrainosus: Secondary | ICD-10-CM

## 2021-11-22 MED ORDER — NURTEC 75 MG PO TBDP: ORAL_TABLET | ORAL | 2 refills | Status: DC

## 2021-11-22 MED ORDER — CYCLOBENZAPRINE HCL 5 MG PO TABS: 5.0000 mg | ORAL_TABLET | Freq: Every evening | ORAL | 1 refills | Status: DC | PRN

## 2021-11-22 NOTE — Progress Notes (Signed)
Established patient visit ? ? ?Patient: Diane Marshall   DOB: [birth year]   21 y.o. Female  MRN: [id number] ?Visit Date: 11/22/2021 ? ? ?Chief Complaint  ?Patient presents with  ? Migraine  ? Weight Loss  ? ?Subjective  ?  ?HPI  ?Follow up for migraine headaches.  ?-started trial topamax 25mg . Advised to take twice daily to reduce frequency and severity of migraine headaches.  ?-nurtec given to use on as needed basis for acute migraines. She has been able to titrate topamax to twice daily dosing. She states that migraines are now every few days rather than everyday. The severity is less. She states that samples of nurtec did work to relieve acute migraine headaches.  ?-having neck pain. This is at the base of her neck. This will sometimes radiate to the left arm. States that her heck feels still all the time. Pain is most severe first thing in the morning and when she is trying to go to sleep. She denies any history of trauma to the neck.  ?-She does have family history of cervical disc disease. ?-continues to have concern about weight. Was unable to tolerate phentermine as it increased her heart rate and blood pressure. Has had a one pound weight gain since she was last seen. She is uncertain if her insurance covers a weight loss benefit.  ? ? ?Medications: ?Outpatient Medications Prior to Visit  ?Medication Sig  ? ciprofloxacin-dexamethasone (CIPRODEX) OTIC suspension Place 4 drops into the right ear 2 (two) times daily.  ? ergocalciferol (DRISDOL) 1.25 MG (50000 UT) capsule Take 1 capsule (50,000 Units total) by mouth once a week.  ? etonogestrel-ethinyl estradiol (NUVARING) 0.12-0.015 MG/24HR vaginal ring   ? topiramate (TOPAMAX) 25 MG tablet TAKE 1 TABLET BY MOUTH AT BEDTIME FOR 7 DAYS THEN 1 TABLET TWICE DAILY AS TOLERATED .  ? [DISCONTINUED] Rimegepant Sulfate (NURTEC) 75 MG TBDP Take 1 tablet by mouth x 1 dose for acute headache. (Max 75 mg/day)  ? [DISCONTINUED] phentermine (ADIPEX-P) 37.5 MG tablet Take 1  tablet (37.5 mg total) by mouth daily before breakfast. (Patient not taking: Reported on 10/24/2021)  ? ?No facility-administered medications prior to visit.  ? ? ?Review of Systems  ?Constitutional:  Negative for activity change, appetite change, chills, fatigue and fever.  ?HENT:  Negative for congestion, postnasal drip, rhinorrhea, sinus pressure, sinus pain, sneezing and sore throat.   ?Eyes: Negative.   ?Respiratory:  Negative for cough, chest tightness, shortness of breath and wheezing.   ?Cardiovascular:  Negative for chest pain and palpitations.  ?Gastrointestinal:  Negative for abdominal pain, constipation, diarrhea, nausea and vomiting.  ?Endocrine: Negative for cold intolerance, heat intolerance, polydipsia and polyuria.  ?Genitourinary:  Negative for dyspareunia, dysuria, flank pain, frequency and urgency.  ?Musculoskeletal:  Negative for arthralgias, back pain and myalgias.  ?Skin:  Negative for rash.  ?Allergic/Immunologic: Negative for environmental allergies.  ?Neurological:  Negative for dizziness, weakness and headaches.  ?Hematological:  Negative for adenopathy.  ?Psychiatric/Behavioral:  The patient is not nervous/anxious.   ? ? ? ? Objective  ?  ? ?Today's Vitals  ? 11/22/21 1436  ?BP: 108/70  ?Pulse: 97  ?Temp: 98.4 ?F (36.9 ?C)  ?SpO2: 96%  ?Weight: 222 lb 1.9 oz (100.8 kg)  ?Height: 5' 1.81" (1.57 m)  ? ?Body mass index is 40.88 kg/m?.  ? ?BP Readings from Last 3 Encounters:  ?11/22/21 108/70  ?10/24/21 123/76  ?10/11/21 105/72  ?  ?Wt Readings from Last 3 Encounters:  ?11/22/21 222 lb  1.9 oz (100.8 kg)  ?10/24/21 221 lb 8 oz (100.5 kg)  ?10/11/21 223 lb 4.8 oz (101.3 kg)  ?  ?Physical Exam ?Vitals and nursing note reviewed.  ?Constitutional:   ?   Appearance: Normal appearance. She is well-developed. She is obese.  ?HENT:  ?   Head: Normocephalic and atraumatic.  ?   Nose: Nose normal.  ?   Mouth/Throat:  ?   Mouth: Mucous membranes are moist.  ?   Pharynx: Oropharynx is clear.  ?Eyes:  ?    Extraocular Movements: Extraocular movements intact.  ?   Conjunctiva/sclera: Conjunctivae normal.  ?   Pupils: Pupils are equal, round, and reactive to light.  ?Cardiovascular:  ?   Rate and Rhythm: Normal rate and regular rhythm.  ?   Pulses: Normal pulses.  ?   Heart sounds: Normal heart sounds.  ?Pulmonary:  ?   Effort: Pulmonary effort is normal.  ?   Breath sounds: Normal breath sounds.  ?Abdominal:  ?   Palpations: Abdomen is soft.  ?Musculoskeletal:     ?   General: Normal range of motion.  ?   Cervical back: Normal range of motion and neck supple.  ?Lymphadenopathy:  ?   Cervical: No cervical adenopathy.  ?Skin: ?   General: Skin is warm and dry.  ?   Capillary Refill: Capillary refill takes less than 2 seconds.  ?Neurological:  ?   General: No focal deficit present.  ?   Mental Status: She is alert and oriented to person, place, and time.  ?Psychiatric:     ?   Mood and Affect: Mood normal.     ?   Behavior: Behavior normal.     ?   Thought Content: Thought content normal.     ?   Judgment: Judgment normal.  ?  ? ? Assessment & Plan  ?  ?1. Chronic migraine with aura ?Improved.  Continue Topamax 25 mg twice daily to help reduce frequency and severity of migraine headaches. ? ?2. Acute migraine ?New prescription for Nurtec 75 mg.  Patient may take once as needed for acute migraine headaches. ?- Rimegepant Sulfate (NURTEC) 75 MG TBDP; Take 1 tablet by mouth x 1 dose for acute headache. (Max 75 mg/day)  Dispense: 10 tablet; Refill: 2 ? ?3. Neck pain of over 3 months duration ?Add Flexeril 5 mg tablets at bedtime as needed for back pain and tension.  We will get x-ray of cervical spine for further evaluation.  Consider referral to orthopedics if indicated. ?- cyclobenzaprine (FLEXERIL) 5 MG tablet; Take 1 tablet (5 mg total) by mouth at bedtime as needed for muscle spasms.  Dispense: 30 tablet; Refill: 1 ?- DG Cervical Spine 2 or 3 views; Future ? ?4. Body mass index (BMI) of 40.1-44.9 in adult  [facility]) ?Discussed lowering calorie intake to 1500 calories per day and incorporating exercise into daily routine to help lose weight.  Patient to contact her insurance to find out if they cover weight loss benefit.  We will do trial of medications as allowed. ? ?Problem List Items Addressed This Visit   ? ?  ? Cardiovascular and Mediastinum  ? Chronic migraine with aura - Primary  ? Relevant Medications  ? cyclobenzaprine (FLEXERIL) 5 MG tablet  ? Rimegepant Sulfate (NURTEC) 75 MG TBDP  ? Acute migraine  ? Relevant Medications  ? cyclobenzaprine (FLEXERIL) 5 MG tablet  ? Rimegepant Sulfate (NURTEC) 75 MG TBDP  ?  ? Other  ? Body mass index (  BMI) of 40.1-44.9 in adult [facility])  ? Neck pain of over 3 months duration  ? Relevant Medications  ? cyclobenzaprine (FLEXERIL) 5 MG tablet  ? Other Relevant Orders  ? DG Cervical Spine 2 or 3 views (Completed)  ?  ? ?Return in about 5 weeks (around 12/27/2021) for neck pain/weight management .  ?   ? ? ? ? ?Ronnell Freshwater, NP  ?[facility] at [facility] ?[phone] (phone) ?[phone] (fax) ? ?[facility]  ?

## 2021-11-26 ENCOUNTER — Encounter: Payer: Self-pay | Admitting: Nurse Practitioner

## 2021-11-26 ENCOUNTER — Ambulatory Visit
Admission: RE | Admit: 2021-11-26 | Discharge: 2021-11-26 | Disposition: A | Discharge status: Home / Self Care | Payer: BC Managed Care – PPO | Source: Ambulatory Visit | Attending: Nurse Practitioner | Admitting: Nurse Practitioner

## 2021-11-26 DIAGNOSIS — M542 Cervicalgia: Secondary | ICD-10-CM

## 2021-11-27 ENCOUNTER — Encounter: Payer: Self-pay | Admitting: Nurse Practitioner

## 2021-11-28 ENCOUNTER — Other Ambulatory Visit: Payer: Self-pay | Admitting: Nurse Practitioner

## 2021-11-28 DIAGNOSIS — Z6841 Body Mass Index (BMI) 40.0 and over, adult: Secondary | ICD-10-CM

## 2021-11-28 MED ORDER — WEGOVY 0.25 MG/0.5ML ~~LOC~~ SOAJ: 0.2500 mg | SUBCUTANEOUS | 1 refills | Status: DC

## 2021-11-28 NOTE — Progress Notes (Signed)
Will start [facility] 0.25mg  weekly for next 4  8 weeks. Follow up as scheduled in June. Will reassess and discuss increasing dose at that time.  ?

## 2021-12-02 DIAGNOSIS — M542 Cervicalgia: Secondary | ICD-10-CM | POA: Insufficient documentation

## 2021-12-02 DIAGNOSIS — G43909 Migraine, unspecified, not intractable, without status migrainosus: Secondary | ICD-10-CM | POA: Insufficient documentation

## 2021-12-02 NOTE — Progress Notes (Signed)
Patient notified of results.

## 2021-12-02 NOTE — Patient Instructions (Signed)
Fat and Cholesterol Restricted Eating Plan ?Getting too much fat and cholesterol in your diet may cause health problems. Choosing the right foods helps keep your fat and cholesterol at normal levels. This can keep you from getting certain diseases. ?Your doctor may recommend an eating plan that includes: ?Total fat: ______% or less of total calories a day. This is ______g of fat a day. ?Saturated fat: ______% or less of total calories a day. This is ______g of saturated fat a day. ?Cholesterol: less than _________mg a day. ?Fiber: ______g a day. ?What are tips for following this plan? ?General tips ?Work with your doctor to lose weight if you need to. ?Avoid: ?Foods with added sugar. ?Fried foods. ?Foods with trans fat or partially hydrogenated oils. This includes some margarines and baked goods. ?If you drink alcohol: ?Limit how much you have to: ?0-1 drink a day for women who are not pregnant. ?0-2 drinks a day for men. ?Know how much alcohol is in a drink. In the [place] one drink equals one 12 oz bottle of beer (355 mL), one 5 oz glass of wine (148 mL), or one 1? oz glass of hard liquor (44 mL). ?Reading food labels ?Check food labels for: ?Trans fats. ?Partially hydrogenated oils. ?Saturated fat (g) in each serving. ?Cholesterol (mg) in each serving. ?Fiber (g) in each serving. ?Choose foods with healthy fats, such as: ?Monounsaturated fats and polyunsaturated fats. These include olive and canola oil, flaxseeds, walnuts, almonds, and seeds. ?Omega-3 fats. These are found in certain fish, flaxseed oil, and ground flaxseeds. ?Choose grain products that have whole grains. Look for the word "whole" as the first word in the ingredient list. ?Cooking ?Cook foods using low-fat methods. These include baking, boiling, grilling, and broiling. ?Eat more home-cooked foods. Eat at restaurants and buffets less often. Eat less fast food. ?Avoid cooking using saturated fats, such as butter, cream, palm oil, palm kernel oil, and  coconut oil. ?Meal planning ? ?At meals, divide your plate into four equal parts: ?Fill one-half of your plate with vegetables, green salads, and fruit. ?Fill one-fourth of your plate with whole grains. ?Fill one-fourth of your plate with low-fat (lean) protein foods. ?Eat fish that is high in omega-3 fats at least two times a week. This includes mackerel, tuna, sardines, and salmon. ?Eat foods that are high in fiber, such as whole grains, beans, apples, pears, berries, broccoli, carrots, peas, and barley. ?What foods should I eat? ?Fruits ?All fresh, canned (in natural juice), or frozen fruits. ?Vegetables ?Fresh or frozen vegetables (raw, steamed, roasted, or grilled). Green salads. ?Grains ?Whole grains, such as whole wheat or whole grain breads, crackers, cereals, and pasta. Unsweetened oatmeal, bulgur, barley, quinoa, or brown rice. Corn or whole wheat flour tortillas. ?Meats and other protein foods ?Ground beef (85% or leaner), grass-fed beef, or beef trimmed of fat. Skinless chicken or turkey. Ground chicken or turkey. Pork trimmed of fat. All fish and seafood. Egg whites. Dried beans, peas, or lentils. Unsalted nuts or seeds. Unsalted canned beans. Nut butters without added sugar or oil. ?Dairy ?Low-fat or nonfat dairy products, such as skim or 1% milk, 2% or reduced-fat cheeses, low-fat and fat-free ricotta or cottage cheese, or plain low-fat and nonfat yogurt. ?Fats and oils ?Tub margarine without trans fats. Light or reduced-fat mayonnaise and salad dressings. Avocado. Olive, canola, sesame, or safflower oils. ?The items listed above may not be a complete list of foods and beverages you can eat. Contact a dietitian for more information. ?What foods   should I avoid? ?Fruits ?Canned fruit in heavy syrup. Fruit in cream or butter sauce. Fried fruit. ?Vegetables ?Vegetables cooked in cheese, cream, or butter sauce. Fried vegetables. ?Grains ?White bread. White pasta. White rice. Cornbread. Bagels, pastries,  and croissants. Crackers and snack foods that contain trans fat and hydrogenated oils. ?Meats and other protein foods ?Fatty cuts of meat. Ribs, chicken wings, bacon, sausage, bologna, salami, chitterlings, fatback, hot dogs, bratwurst, and packaged lunch meats. Liver and organ meats. Whole eggs and egg yolks. Chicken and turkey with skin. Fried meat. ?Dairy ?Whole or 2% milk, cream, half-and-half, and cream cheese. Whole milk cheeses. Whole-fat or sweetened yogurt. Full-fat cheeses. Nondairy creamers and whipped toppings. Processed cheese, cheese spreads, and cheese curds. ?Fats and oils ?Butter, stick margarine, lard, shortening, ghee, or bacon fat. Coconut, palm kernel, and palm oils. ?Beverages ?Alcohol. Sugar-sweetened drinks such as sodas, lemonade, and fruit drinks. ?Sweets and desserts ?Corn syrup, sugars, honey, and molasses. Candy. Jam and jelly. Syrup. Sweetened cereals. Cookies, pies, cakes, donuts, muffins, and ice cream. ?The items listed above may not be a complete list of foods and beverages you should avoid. Contact a dietitian for more information. ?Summary ?Choosing the right foods helps keep your fat and cholesterol at normal levels. This can keep you from getting certain diseases. ?At meals, fill one-half of your plate with vegetables, green salads, and fruits. ?Eat high fiber foods, like whole grains, beans, apples, pears, berries, carrots, peas, and barley. ?Limit added sugar, saturated fats, alcohol, and fried foods. ?This information is not intended to replace advice given to you by your health care provider. Make sure you discuss any questions you have with your health care provider. ?Document Revised: 11/17/2020 Document Reviewed: 11/17/2020 ?Elsevier Patient Education ? 2023 Elsevier Inc[employer] ? ?

## 2021-12-11 ENCOUNTER — Other Ambulatory Visit: Payer: Self-pay | Admitting: Nurse Practitioner

## 2021-12-11 DIAGNOSIS — Z6841 Body Mass Index (BMI) 40.0 and over, adult: Secondary | ICD-10-CM

## 2021-12-11 DIAGNOSIS — R7301 Impaired fasting glucose: Secondary | ICD-10-CM

## 2021-12-11 MED ORDER — OZEMPIC (0.25 OR 0.5 MG/DOSE) 2 MG/3ML ~~LOC~~ SOPN: 0.2500 mg | PEN_INJECTOR | SUBCUTANEOUS | 1 refills | Status: DC

## 2021-12-11 MED ORDER — INSULIN PEN NEEDLE 29G X 12MM MISC: 1 refills | Status: DC

## 2021-12-11 MED ORDER — SAXENDA 18 MG/3ML ~~LOC~~ SOPN: PEN_INJECTOR | SUBCUTANEOUS | 0 refills | Status: DC

## 2021-12-11 NOTE — Telephone Encounter (Signed)
Sent prescription for ozempic to pharmacy to replace wegovy. Saxenda not approved per her insurance.

## 2021-12-11 NOTE — Progress Notes (Signed)
Wegovy difficult to get due to lack of supply. Trial saxenda sent to her pharmacy along with pen needles to use daiy.

## 2021-12-11 NOTE — Telephone Encounter (Signed)
Ok thanks 

## 2021-12-14 ENCOUNTER — Other Ambulatory Visit: Payer: Self-pay | Admitting: Nurse Practitioner

## 2021-12-14 DIAGNOSIS — G43109 Migraine with aura, not intractable, without status migrainosus: Secondary | ICD-10-CM

## 2021-12-17 DIAGNOSIS — G43909 Migraine, unspecified, not intractable, without status migrainosus: Secondary | ICD-10-CM | POA: Diagnosis not present

## 2021-12-17 DIAGNOSIS — Z792 Long term (current) use of antibiotics: Secondary | ICD-10-CM | POA: Diagnosis not present

## 2021-12-17 DIAGNOSIS — R079 Chest pain, unspecified: Secondary | ICD-10-CM | POA: Diagnosis not present

## 2021-12-17 DIAGNOSIS — R0789 Other chest pain: Secondary | ICD-10-CM | POA: Diagnosis not present

## 2021-12-17 DIAGNOSIS — E669 Obesity, unspecified: Secondary | ICD-10-CM | POA: Diagnosis not present

## 2021-12-17 DIAGNOSIS — F410 Panic disorder [episodic paroxysmal anxiety] without agoraphobia: Secondary | ICD-10-CM | POA: Diagnosis not present

## 2021-12-17 DIAGNOSIS — Z7289 Other problems related to lifestyle: Secondary | ICD-10-CM | POA: Diagnosis not present

## 2021-12-17 DIAGNOSIS — R110 Nausea: Secondary | ICD-10-CM | POA: Diagnosis not present

## 2021-12-18 DIAGNOSIS — Z3490 Encounter for supervision of normal pregnancy, unspecified, unspecified trimester: Secondary | ICD-10-CM | POA: Diagnosis not present

## 2021-12-18 DIAGNOSIS — Z6838 Body mass index (BMI) 38.0-38.9, adult: Secondary | ICD-10-CM | POA: Diagnosis not present

## 2021-12-18 DIAGNOSIS — O26899 Other specified pregnancy related conditions, unspecified trimester: Secondary | ICD-10-CM | POA: Diagnosis not present

## 2021-12-18 DIAGNOSIS — F410 Panic disorder [episodic paroxysmal anxiety] without agoraphobia: Secondary | ICD-10-CM | POA: Diagnosis not present

## 2021-12-18 DIAGNOSIS — Z3A00 Weeks of gestation of pregnancy not specified: Secondary | ICD-10-CM | POA: Diagnosis not present

## 2021-12-18 DIAGNOSIS — R110 Nausea: Secondary | ICD-10-CM | POA: Diagnosis not present

## 2021-12-21 ENCOUNTER — Encounter: Payer: Self-pay | Admitting: Nurse Practitioner

## 2021-12-25 ENCOUNTER — Ambulatory Visit: Payer: BC Managed Care – PPO | Admitting: Nurse Practitioner

## 2021-12-31 ENCOUNTER — Ambulatory Visit (INDEPENDENT_AMBULATORY_CARE_PROVIDER_SITE_OTHER): Payer: BC Managed Care – PPO | Admitting: Physician Assistant

## 2021-12-31 ENCOUNTER — Encounter: Payer: Self-pay | Admitting: Physician Assistant

## 2021-12-31 VITALS — BP 102/70 | HR 90 | Temp 97.8°F | Ht 61.81 in | Wt 219.9 lb

## 2021-12-31 DIAGNOSIS — M545 Low back pain, unspecified: Secondary | ICD-10-CM

## 2021-12-31 MED ORDER — LIDOCAINE 5 % EX PTCH: 1.0000 | MEDICATED_PATCH | CUTANEOUS | 0 refills | Status: DC

## 2021-12-31 NOTE — Progress Notes (Signed)
  Established patient acute visit   Patient: Diane Marshall   DOB: 13-Nov-2000   21 y.o. Female  MRN: [id number] Visit Date: 12/31/2021  Chief Complaint  Patient presents with   Back Pain   Subjective    HPI  Patient presenting with c/o low back pain which started last Thursday. Pain comes and goes depending on what she is doing. 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 Bending seemed to make the pain worse. No numbness or tingling sensation. No bladder or bowel dysfunction. Rates the pain a 7/10 last Thursday and currently 4/10. Has tried Tylenol 1000 mg and back stretches. Medication did not help last Thursday. Patient is about [redacted] weeks pregnant has her initial prenatal visit July 6th.      Medications: Outpatient Medications Prior to Visit  Medication Sig   Semaglutide,0.25 or 0.5MG /DOS, (OZEMPIC, 0.25 OR 0.5 MG/DOSE,) 2 MG/3ML SOPN Inject 0.25 mg into the skin once a week.   ciprofloxacin-dexamethasone (CIPRODEX) OTIC suspension Place 4 drops into the right ear 2 (two) times daily.   cyclobenzaprine (FLEXERIL) 5 MG tablet Take 1 tablet (5 mg total) by mouth at bedtime as needed for muscle spasms.   ergocalciferol (DRISDOL) 1.25 MG (50000 UT) capsule Take 1 capsule (50,000 Units total) by mouth once a week.   etonogestrel-ethinyl estradiol (NUVARING) 0.12-0.015 MG/24HR vaginal ring    Insulin Pen Needle 29G X MISC To use daily with saxenda injections   Rimegepant Sulfate (NURTEC) 75 MG TBDP Take 1 tablet by mouth x 1 dose for acute headache. (Max 75 mg/day)   topiramate (TOPAMAX) 25 MG tablet TAKE 1 TABLET BY MOUTH AT BEDTIME FOR 7 DAYS THEN 1 TABLET TWICE DAILY AS TOLERATED .   No facility-administered medications prior to visit.    Review of Systems Review of Systems:  A fourteen system review of systems was performed and found to be positive as per HPI.     Objective    BP 102/70   Pulse 90   Temp 97.8 F  (36.6 C)   Ht 5' 1.81" (1.57 m)   Wt 219 lb 14.4 oz (99.7 kg)   SpO2 98%   BMI 40.47 kg/m    Physical Exam  General:  Pleasant and cooperative, appropriate for stated age.  Neuro:  Alert and oriented,  extra-ocular muscles intact  HEENT:  Normocephalic, atraumatic, neck supple  Skin:  no gross rash, warm, pink. Cardiac:  RRR, S1 S2 Respiratory: CTA B/L  MSK: tenderness of low back near sacrum. No step-off noted. Good spinal ROM.  Vascular:  Ext warm, no cyanosis apprec.; cap RF less 2 sec. Psych:  No HI/SI, judgement and insight good, Euthymic mood. Full Affect.   No results found for any visits on 12/31/21.  Assessment & Plan     Patient presenting with s/sx suggestive of lumbar strain. Discussed conservative therapy with heat/ice, Tylenol 1000 mg every 6-8 hrs as needed and recommend to use topical lidocaine patch. Provided exercises/stretches. Recommend to follow-up with OB/GYN as scheduled.    No follow-ups on file.        Mayer Masker, PA-C  [facility] [phone] (phone) [phone] (fax)  [facility]

## 2021-12-31 NOTE — Patient Instructions (Signed)
Low Back Sprain or Strain Rehab Ask your health care provider which exercises are safe for you. Do exercises exactly as told by your health care provider and adjust them as directed. It is normal to feel mild stretching, pulling, tightness, or discomfort as you do these exercises. Stop right away if you feel sudden pain or your pain gets worse. Do not begin these exercises until told by your health care provider. Stretching and range-of-motion exercises These exercises warm up your muscles and joints and improve the movement and flexibility of your back. These exercises also help to relieve pain, numbness, and tingling. Lumbar rotation  Lie on your back on a firm bed or the floor with your knees bent. Straighten your arms out to your sides so each arm forms a 90-degree angle (right angle) with a side of your body. Slowly move (rotate) both of your knees to one side of your body until you feel a stretch in your lower back (lumbar). Try not to let your shoulders lift off the floor. Hold this position for __________ seconds. Tense your abdominal muscles and slowly move your knees back to the starting position. Repeat this exercise on the other side of your body. Repeat __________ times. Complete this exercise __________ times a day. Single knee to chest  Lie on your back on a firm bed or the floor with both legs straight. Bend one of your knees. Use your hands to move your knee up toward your chest until you feel a gentle stretch in your lower back and buttock. Hold your leg in this position by holding on to the front of your knee. Keep your other leg as straight as possible. Hold this position for __________ seconds. Slowly return to the starting position. Repeat with your other leg. Repeat __________ times. Complete this exercise __________ times a day. Prone extension on elbows  Lie on your abdomen on a firm bed or the floor (prone position). Prop yourself up on your elbows. Use your arms  to help lift your chest up until you feel a gentle stretch in your abdomen and your lower back. This will place some of your body weight on your elbows. If this is uncomfortable, try stacking pillows under your chest. Your hips should stay down, against the surface that you are lying on. Keep your hip and back muscles relaxed. Hold this position for __________ seconds. Slowly relax your upper body and return to the starting position. Repeat __________ times. Complete this exercise __________ times a day. Strengthening exercises These exercises build strength and endurance in your back. Endurance is the ability to use your muscles for a long time, even after they get tired. Pelvic tilt This exercise strengthens the muscles that lie deep in the abdomen. Lie on your back on a firm bed or the floor with your legs extended. Bend your knees so they are pointing toward the ceiling and your feet are flat on the floor. Tighten your lower abdominal muscles to press your lower back against the floor. This motion will tilt your pelvis so your tailbone points up toward the ceiling instead of pointing to your feet or the floor. To help with this exercise, you may place a small towel under your lower back and try to push your back into the towel. Hold this position for __________ seconds. Let your muscles relax completely before you repeat this exercise. Repeat __________ times. Complete this exercise __________ times a day. Alternating arm and leg raises  Get on your hands   and knees on a firm surface. If you are on a hard floor, you may want to use padding, such as an exercise mat, to cushion your knees. Line up your arms and legs. Your hands should be directly below your shoulders, and your knees should be directly below your hips. Lift your left leg behind you. At the same time, raise your right arm and straighten it in front of you. Do not lift your leg higher than your hip. Do not lift your arm higher  than your shoulder. Keep your abdominal and back muscles tight. Keep your hips facing the ground. Do not arch your back. Keep your balance carefully, and do not hold your breath. Hold this position for __________ seconds. Slowly return to the starting position. Repeat with your right leg and your left arm. Repeat __________ times. Complete this exercise __________ times a day. Abdominal set with straight leg raise  Lie on your back on a firm bed or the floor. Bend one of your knees and keep your other leg straight. Tense your abdominal muscles and lift your straight leg up, 4-6 inches (10-15 cm) off the ground. Keep your abdominal muscles tight and hold this position for __________ seconds. Do not hold your breath. Do not arch your back. Keep it flat against the ground. Keep your abdominal muscles tense as you slowly lower your leg back to the starting position. Repeat with your other leg. Repeat __________ times. Complete this exercise __________ times a day. Single leg lower with bent knees Lie on your back on a firm bed or the floor. Tense your abdominal muscles and lift your feet off the floor, one foot at a time, so your knees and hips are bent in 90-degree angles (right angles). Your knees should be over your hips and your lower legs should be parallel to the floor. Keeping your abdominal muscles tense and your knee bent, slowly lower one of your legs so your toe touches the ground. Lift your leg back up to return to the starting position. Do not hold your breath. Do not let your back arch. Keep your back flat against the ground. Repeat with your other leg. Repeat __________ times. Complete this exercise __________ times a day. Posture and body mechanics Good posture and healthy body mechanics can help to relieve stress in your body's tissues and joints. Body mechanics refers to the movements and positions of your body while you do your daily activities. Posture is part of body  mechanics. Good posture means: Your spine is in its natural S-curve position (neutral). Your shoulders are pulled back slightly. Your head is not tipped forward (neutral). Follow these guidelines to improve your posture and body mechanics in your everyday activities. Standing  When standing, keep your spine neutral and your feet about hip-width apart. Keep a slight bend in your knees. Your ears, shoulders, and hips should line up. When you do a task in which you stand in one place for a long time, place one foot up on a stable object that is 2-4 inches (5-10 cm) high, such as a footstool. This helps keep your spine neutral. Sitting  When sitting, keep your spine neutral and keep your feet flat on the floor. Use a footrest, if necessary, and keep your thighs parallel to the floor. Avoid rounding your shoulders, and avoid tilting your head forward. When working at a desk or a computer, keep your desk at a height where your hands are slightly lower than your elbows. Slide your   chair under your desk so you are close enough to maintain good posture. When working at a computer, place your monitor at a height where you are looking straight ahead and you do not have to tilt your head forward or downward to look at the screen. Resting When lying down and resting, avoid positions that are most painful for you. If you have pain with activities such as sitting, bending, stooping, or squatting, lie in a position in which your body does not bend very much. For example, avoid curling up on your side with your arms and knees near your chest (fetal position). If you have pain with activities such as standing for a long time or reaching with your arms, lie with your spine in a neutral position and bend your knees slightly. Try the following positions: Lying on your side with a pillow between your knees. Lying on your back with a pillow under your knees. Lifting  When lifting objects, keep your feet at least  shoulder-width apart and tighten your abdominal muscles. Bend your knees and hips and keep your spine neutral. It is important to lift using the strength of your legs, not your back. Do not lock your knees straight out. Always ask for help to lift heavy or awkward objects. This information is not intended to replace advice given to you by your health care provider. Make sure you discuss any questions you have with your health care provider. Document Revised: 09/25/2020 Document Reviewed: 09/25/2020 Elsevier Patient Education  2023 [employer] Inc.  

## 2022-01-02 ENCOUNTER — Encounter: Payer: Self-pay | Admitting: Nurse Practitioner

## 2022-01-02 ENCOUNTER — Telehealth: Payer: Self-pay | Admitting: Nurse Practitioner

## 2022-01-02 NOTE — Telephone Encounter (Signed)
Patient is requesting a note to stay out of work a couple of days due to the back pain she was seen for. Please advise.

## 2022-01-02 NOTE — Telephone Encounter (Signed)
Printing note for dates 6/12-6/16 per [place]. AS, CMA

## 2022-01-03 NOTE — Telephone Encounter (Signed)
lmtc

## 2022-01-22 ENCOUNTER — Other Ambulatory Visit: Payer: Self-pay | Admitting: Nurse Practitioner

## 2022-01-22 DIAGNOSIS — G43109 Migraine with aura, not intractable, without status migrainosus: Secondary | ICD-10-CM

## 2022-01-22 DIAGNOSIS — G43E09 Chronic migraine with aura, not intractable, without status migrainosus: Secondary | ICD-10-CM

## 2022-01-26 ENCOUNTER — Other Ambulatory Visit: Payer: Self-pay | Admitting: Nurse Practitioner

## 2022-01-26 DIAGNOSIS — G43109 Migraine with aura, not intractable, without status migrainosus: Secondary | ICD-10-CM

## 2022-01-29 DIAGNOSIS — H43393 Other vitreous opacities, bilateral: Secondary | ICD-10-CM | POA: Diagnosis not present

## 2022-02-03 NOTE — Progress Notes (Signed)
Established patient visit   Patient: Diane Marshall   DOB: [birth year]   21 y.o. Female  MRN: [id number] Visit Date: 02/04/2022   Chief Complaint  Patient presents with   Back Pain   Subjective    HPI  Follow up visit -low back pain - this has been persistent sespite conservative therapy.  --seen most recently on 12/31/2021  --[redacted] weeks pregnant at the time. Has suffered miscarriage since then.  --continue treatment with tylenol and lidocaine patches along with gentle stretches.  -states that she did have miscarriage approximately three to four weeks ago.  -had been on light duty at work due to back pain prior to her pregnancy.  She states that this did help.  -regular duty at work started a few days ago and pain in her back is already starting to worsen.  -has tried tylenol, lidocaine patches and flexeril to help with muscle pain.  --states that nothing seems to be making a difference.  -being on her feet, reaching and lifting with the arms makes the pain much worse.      Medications: Outpatient Medications Prior to Visit  Medication Sig   ciprofloxacin-dexamethasone (CIPRODEX) OTIC suspension Place 4 drops into the right ear 2 (two) times daily.   ergocalciferol (DRISDOL) 1.25 MG (50000 UT) capsule Take 1 capsule (50,000 Units total) by mouth once a week.   etonogestrel-ethinyl estradiol (NUVARING) 0.12-0.015 MG/24HR vaginal ring    Insulin Pen Needle 29G X 12MM MISC To use daily with saxenda injections   lidocaine (LIDODERM) 5 % Place 1 patch onto the skin daily. Remove & Discard patch within 12 hours or as directed by MD   Rimegepant Sulfate (NURTEC) 75 MG TBDP Take 1 tablet by mouth x 1 dose for acute headache. (Max 75 mg/day)   Semaglutide,0.25 or 0.5MG /DOS, (OZEMPIC, 0.25 OR 0.5 MG/DOSE,) 2 MG/3ML SOPN Inject 0.25 mg into the skin once a week.   topiramate (TOPAMAX) 25 MG tablet TAKE 1 TABLET BY MOUTH AT BEDTIME FOR 7 DAYS THEN 1 TABLET TWICE DAILY AS TOLERATED .    [DISCONTINUED] cyclobenzaprine (FLEXERIL) 5 MG tablet Take 1 tablet (5 mg total) by mouth at bedtime as needed for muscle spasms.   No facility-administered medications prior to visit.    Review of Systems  Constitutional:  Positive for activity change and fatigue. Negative for appetite change, chills and fever.  HENT:  Negative for congestion, postnasal drip, rhinorrhea, sinus pressure, sinus pain, sneezing and sore throat.   Eyes: Negative.   Respiratory:  Negative for cough, chest tightness, shortness of breath and wheezing.   Cardiovascular:  Negative for chest pain and palpitations.  Gastrointestinal:  Negative for abdominal pain, constipation, diarrhea, nausea and vomiting.  Endocrine: Negative for cold intolerance, heat intolerance, polydipsia and polyuria.  Genitourinary:  Negative for dyspareunia, dysuria, flank pain, frequency and urgency.  Musculoskeletal:  Positive for arthralgias, back pain, myalgias and neck pain.  Skin:  Negative for rash.  Allergic/Immunologic: Negative for environmental allergies.  Neurological:  Negative for dizziness, weakness and headaches.  Hematological:  Negative for adenopathy.  Psychiatric/Behavioral:  The patient is not nervous/anxious.       Objective     Today's Vitals   02/04/22 1552  BP: 101/66  Pulse: 82  SpO2: 98%  Weight: 218 lb 6.4 oz (99.1 kg)  Height: 5' 1.81" (1.57 m)   Body mass index is 40.19 kg/m.   BP Readings from Last 3 Encounters:  02/04/22 101/66  12/31/21 102/70  11/22/21 108/70  Wt Readings from Last 3 Encounters:  02/04/22 218 lb 6.4 oz (99.1 kg)  12/31/21 219 lb 14.4 oz (99.7 kg)  11/22/21 222 lb 1.9 oz (100.8 kg)    Physical Exam Vitals and nursing note reviewed.  Constitutional:      Appearance: Normal appearance. She is well-developed. She is obese.  HENT:     Head: Normocephalic and atraumatic.  Eyes:     Pupils: Pupils are equal, round, and reactive to light.  Cardiovascular:     Rate and  Rhythm: Normal rate and regular rhythm.     Pulses: Normal pulses.     Heart sounds: Normal heart sounds.  Pulmonary:     Effort: Pulmonary effort is normal.     Breath sounds: Normal breath sounds.  Abdominal:     Palpations: Abdomen is soft.  Musculoskeletal:        General: Normal range of motion.     Cervical back: Normal range of motion and neck supple. Pain with movement, spinous process tenderness and muscular tenderness present.       Back:     Comments: Moderate lower back pain which is more severe with bending and twisting at the waist. Also more severe with lifting and carrying anything over five pounds.   Lymphadenopathy:     Cervical: No cervical adenopathy.  Skin:    General: Skin is warm and dry.     Capillary Refill: Capillary refill takes less than 2 seconds.  Neurological:     General: No focal deficit present.     Mental Status: She is alert and oriented to person, place, and time.  Psychiatric:        Mood and Affect: Mood normal.        Behavior: Behavior normal.        Thought Content: Thought content normal.        Judgment: Judgment normal.       Assessment & Plan    1. Chronic midline low back pain without sciatica Midline low back pain is chronic.  Trial Medrol taper.  Take as directed for 6 days.  May take etodolac 400 mg up to twice daily as needed for pain and inflammation.  Flexeril 10 mg may be taken at bedtime as needed for muscle pain and spasms.  Refer to orthopedics for further evaluation and treatment.  A work note was given to the patient requesting she stay on light duty until evaluated and released by orthopedic provider. - methylPREDNISolone (MEDROL) 4 MG TBPK tablet; Take by mouth as directed for 6 days  Dispense: 21 tablet; Refill: 0 - etodolac (LODINE) 400 MG tablet; Take 1 tablet (400 mg total) by mouth 2 (two) times daily.  Dispense: 60 tablet; Refill: 3 - cyclobenzaprine (FLEXERIL) 10 MG tablet; Take 1 tablet (10 mg total) by mouth at  bedtime as needed for muscle spasms.  Dispense: 30 tablet; Refill: 1 - Ambulatory referral to Orthopedic Surgery  2. Neck pain of over 3 months duration rial Medrol taper.  Take as directed for 6 days.  May take etodolac 400 mg up to twice daily as needed for pain and inflammation.  Flexeril 10 mg may be taken at bedtime as needed for muscle pain and spasms.  Refer to orthopedics for further evaluation and treatment.    Problem List Items Addressed This Visit       Other   Neck pain of over 3 months duration   Relevant Medications   methylPREDNISolone (MEDROL) 4 MG TBPK  tablet   etodolac (LODINE) 400 MG tablet   cyclobenzaprine (FLEXERIL) 10 MG tablet   Chronic midline low back pain without sciatica - Primary   Relevant Medications   methylPREDNISolone (MEDROL) 4 MG TBPK tablet   etodolac (LODINE) 400 MG tablet   cyclobenzaprine (FLEXERIL) 10 MG tablet   Other Relevant Orders   Ambulatory referral to Orthopedic Surgery     Return in about 4 weeks (around 03/04/2022) for routine - weight management and back pain recheck .         Carlean Jews, NP  [facility] at [facility] [phone] (phone) [phone] (fax)  [facility]

## 2022-02-04 ENCOUNTER — Encounter: Payer: Self-pay | Admitting: Nurse Practitioner

## 2022-02-04 ENCOUNTER — Ambulatory Visit (INDEPENDENT_AMBULATORY_CARE_PROVIDER_SITE_OTHER): Payer: BC Managed Care – PPO | Admitting: Nurse Practitioner

## 2022-02-04 VITALS — BP 101/66 | HR 82 | Ht 61.81 in | Wt 218.4 lb

## 2022-02-04 DIAGNOSIS — G8929 Other chronic pain: Secondary | ICD-10-CM | POA: Diagnosis not present

## 2022-02-04 DIAGNOSIS — M545 Low back pain, unspecified: Secondary | ICD-10-CM

## 2022-02-04 DIAGNOSIS — M542 Cervicalgia: Secondary | ICD-10-CM

## 2022-02-04 MED ORDER — ETODOLAC 400 MG PO TABS: 400.0000 mg | ORAL_TABLET | Freq: Two times a day (BID) | ORAL | 3 refills | Status: DC

## 2022-02-04 MED ORDER — METHYLPREDNISOLONE 4 MG PO TBPK: ORAL_TABLET | ORAL | 0 refills | Status: DC

## 2022-02-04 MED ORDER — CYCLOBENZAPRINE HCL 10 MG PO TABS: 10.0000 mg | ORAL_TABLET | Freq: Every evening | ORAL | 1 refills | Status: DC | PRN

## 2022-02-11 ENCOUNTER — Ambulatory Visit: Payer: BC Managed Care – PPO | Admitting: Physical Medicine and Rehabilitation

## 2022-02-17 DIAGNOSIS — M545 Low back pain, unspecified: Secondary | ICD-10-CM | POA: Insufficient documentation

## 2022-02-18 ENCOUNTER — Ambulatory Visit: Payer: BC Managed Care – PPO | Admitting: Physical Medicine and Rehabilitation

## 2022-02-18 ENCOUNTER — Telehealth: Payer: Self-pay | Admitting: Physical Medicine and Rehabilitation

## 2022-02-18 NOTE — Telephone Encounter (Signed)
Patient called. Need to RSC her appointment with Dr. Newton.  

## 2022-02-19 ENCOUNTER — Encounter: Payer: Self-pay | Admitting: Physical Medicine and Rehabilitation

## 2022-02-19 ENCOUNTER — Ambulatory Visit (INDEPENDENT_AMBULATORY_CARE_PROVIDER_SITE_OTHER): Payer: BC Managed Care – PPO | Admitting: Physical Medicine and Rehabilitation

## 2022-02-19 ENCOUNTER — Ambulatory Visit (INDEPENDENT_AMBULATORY_CARE_PROVIDER_SITE_OTHER): Payer: BC Managed Care – PPO

## 2022-02-19 VITALS — BP 114/80 | HR 79

## 2022-02-19 DIAGNOSIS — M545 Low back pain, unspecified: Secondary | ICD-10-CM

## 2022-02-19 DIAGNOSIS — M47816 Spondylosis without myelopathy or radiculopathy, lumbar region: Secondary | ICD-10-CM

## 2022-02-19 DIAGNOSIS — M7918 Myalgia, other site: Secondary | ICD-10-CM | POA: Diagnosis not present

## 2022-02-19 DIAGNOSIS — G8929 Other chronic pain: Secondary | ICD-10-CM

## 2022-02-19 MED ORDER — MELOXICAM 15 MG PO TABS: 15.0000 mg | ORAL_TABLET | Freq: Every day | ORAL | 0 refills | Status: AC

## 2022-02-19 NOTE — Progress Notes (Unsigned)
Diane Marshall - 21 y.o. female MRN [id number]  Date of birth: 06-Sep-2000  Office Visit Note: Visit Date: 02/19/2022 PCP: Carlean Jews, NP Referred by: Carlean Jews, NP  Subjective: Chief Complaint  Patient presents with   Lower Back - Pain   HPI: Diane Marshall is a 21 y.o. female who comes in today per the request of Vincent Gros, NP for evaluation of chronic, worsening and severe bilateral lower back pain with intermittent radiation of pain up back. Pain ongoing for 3 months, no injuries/trauma associated with onset of pain. Pain is exacerbated by movement and activity, she describes pain as sore and aching sensation, currently rates as 10 out of 10. Some relief of pain with home exercise regimen, rest and use of medications. Currently taking Prednisone and Flexeril prescribed by primary care provider. No history of lumbar surgery, I am unable to locate lumbar imaging studies in her chart. Patient states she works in [employer] and her pain becomes severe when she is at work. States her pain does subside significantly with rest and sitting. Patient states she has been placed on light duty at work. Patient denies focal weakness, numbness and tingling. Patient denies recent trauma or falls.    Review of Systems  Musculoskeletal:  Positive for back pain and myalgias.  Neurological:  Negative for tingling, sensory change, focal weakness and weakness.  All other systems reviewed and are negative.  Otherwise per HPI.  Assessment & Plan: Visit Diagnoses:    ICD-10-CM   1. Chronic bilateral low back pain without sciatica  M54.50 XR Lumbar Spine Complete   G89.29 Ambulatory referral to Physical Therapy    2. Spondylosis without myelopathy or radiculopathy, lumbar region  M47.816     3. Myofascial pain syndrome  M79.18 Ambulatory referral to Physical Therapy       Plan: Findings:  Chronic, worsening and severe bilateral lower back pain. No radicular symptoms. Patient  continues to have severe pain despite good conservative therapies such as home exercise regimen, rest and use of medications. Patients clinical presentation and exam are consistent with myofascial pain syndrome. Lumbar paraspinal region tender to palpation upon exam today. Next step is to place order for formal physical therapy with our in house team. I feel patient would benefit from manual treatments and possible dry needling. I also discussed medication management with patient, I instructed her to finish Prednisone taper and continue with Flexeril as needed at bedtime. I did add Meloxicam by mouth once daily for 3 weeks to her regimen as she did not get recent anti-inflammatory medication filled from PCP. I would like to have patient follow up with me in 6 weeks for re-assessment. I encouraged her to remain active at home as tolerated. No red flag symptoms noted.     Meds & Orders:  Meds ordered this encounter  Medications   meloxicam (MOBIC) 15 MG tablet    Sig: Take 1 tablet (15 mg total) by mouth daily.    Dispense:  30 tablet    Refill:  0    Orders Placed This Encounter  Procedures   XR Lumbar Spine Complete   Ambulatory referral to Physical Therapy    Follow-up: Return for 6 week follow up for re-evaluation.   Procedures: No procedures performed      Clinical History: No specialty comments available.   She reports that she has never smoked. She has never used smokeless tobacco.  Recent Labs    10/04/21 0932  HGBA1C 5.2  Objective:  VS:  HT:    WT:   BMI:     BP:114/80  HR:79bpm  TEMP: ( )  RESP:  Physical Exam Vitals and nursing note reviewed.  HENT:     Head: Normocephalic and atraumatic.     Right Ear: External ear normal.     Left Ear: External ear normal.     Nose: Nose normal.     Mouth/Throat:     Mouth: Mucous membranes are moist.  Eyes:     Extraocular Movements: Extraocular movements intact.  Cardiovascular:     Rate and Rhythm: Normal rate.      Pulses: Normal pulses.  Pulmonary:     Effort: Pulmonary effort is normal.  Abdominal:     General: Abdomen is flat. There is no distension.  Musculoskeletal:        General: Tenderness present.     Cervical back: Normal range of motion.     Comments: Pt rises from seated position to standing without difficulty. Good lumbar range of motion. Strong distal strength without clonus, no pain upon palpation of greater trochanters. Sensation intact bilaterally. Multiple palpable trigger points noted to bilateral lumbar paraspinal region. Walks independently, gait steady.   Skin:    General: Skin is warm and dry.     Capillary Refill: Capillary refill takes less than 2 seconds.  Neurological:     General: No focal deficit present.     Mental Status: She is alert and oriented to person, place, and time.  Psychiatric:        Mood and Affect: Mood normal.        Behavior: Behavior normal.     Ortho Exam  Imaging: No results found.  Past Medical/Family/Surgical/Social History: Medications & Allergies reviewed per EMR, new medications updated. Patient Active Problem List   Diagnosis Date Noted   Chronic midline low back pain without sciatica 02/17/2022   Acute migraine 12/02/2021   Neck pain of over 3 months duration 12/02/2021   Chronic migraine with aura 10/24/2021   Chronic otitis media 10/11/2021   Vitamin D deficiency 10/11/2021   Hyperlipidemia with target LDL less than 100 10/11/2021   Body mass index (BMI) of 40.1-44.9 in adult (HCC) 09/27/2021   Other fatigue 09/27/2021   BMI 35.0-35.9,adult 09/01/2019   Cough in adult 07/01/2019   Normal labor 04/14/2019   Urinary frequency 09/29/2018   Healthcare maintenance 05/06/2018   Lower abdominal pain    Encopresis    Alternating constipation and diarrhea    Past Medical History:  Diagnosis Date   Abdominal pain    Encopresis    Loose stools    Family History  Problem Relation Age of Onset   Lactose intolerance Sister     Hypertension Sister    Hypertension Sister    Lactose intolerance Father    Hypertension Father    Cholelithiasis Paternal Grandmother    Cancer Paternal Grandmother        colon   Hypertension Paternal Grandmother    Heart attack Maternal Grandfather    Celiac disease Neg Hx    Ulcers Neg Hx    Irritable bowel syndrome Neg Hx    Past Surgical History:  Procedure Laterality Date   NO PAST SURGERIES     Social History   Occupational History   Not on file  Tobacco Use   Smoking status: Never   Smokeless tobacco: Never  Vaping Use   Vaping Use: Never used  Substance and Sexual Activity  Alcohol use: Never   Drug use: Never   Sexual activity: Yes

## 2022-02-19 NOTE — Progress Notes (Unsigned)
Numeric Pain Rating Scale and Functional Assessment Average Pain 4  Lower back midline pain for 3 months. In the last MONTH (on 0-10 scale) has pain interfered with the following?  1. General activity like being  able to carry out your everyday physical activities such as walking, climbing stairs, carrying groceries, or moving a chair?  Rating(10 With activities.)   +Driver, -BT, -Dye Allergies.

## 2022-02-20 ENCOUNTER — Other Ambulatory Visit: Payer: Self-pay | Admitting: Nurse Practitioner

## 2022-02-20 DIAGNOSIS — R7301 Impaired fasting glucose: Secondary | ICD-10-CM

## 2022-02-20 DIAGNOSIS — Z6841 Body Mass Index (BMI) 40.0 and over, adult: Secondary | ICD-10-CM

## 2022-02-20 MED ORDER — OZEMPIC (0.25 OR 0.5 MG/DOSE) 2 MG/3ML ~~LOC~~ SOPN: 0.5000 mg | PEN_INJECTOR | SUBCUTANEOUS | 1 refills | Status: DC

## 2022-02-20 NOTE — Telephone Encounter (Signed)
I increased the dose to 0.5 mg and sent new prescription to [facility] in [place]

## 2022-02-20 NOTE — Progress Notes (Signed)
Increase dose ozempic to 0.5 mg weekly and sent to [facility] in [place]

## 2022-02-26 ENCOUNTER — Ambulatory Visit: Payer: BC Managed Care – PPO | Admitting: Orthopaedic Surgery

## 2022-03-03 NOTE — Progress Notes (Unsigned)
Established patient visit   Patient: Diane Marshall   DOB: 2000/10/03   21 y.o. Female  MRN: [id number] Visit Date: 03/04/2022   Chief Complaint  Patient presents with   Weight Check   Back Pain   Subjective    HPI  Follow up -chronic low back pain - being evaluated per orthopedics  -starts physical therapy for her lower back on Wednesday  -trial ozempic to help with weight management and controlling blood sugar.  -she is taking 0.25 mg dose.  -has lost 4 pounds since last seen  -no negative side effects     Medications: Outpatient Medications Prior to Visit  Medication Sig   ciprofloxacin-dexamethasone (CIPRODEX) OTIC suspension Place 4 drops into the right ear 2 (two) times daily.   cyclobenzaprine (FLEXERIL) 10 MG tablet Take 1 tablet (10 mg total) by mouth at bedtime as needed for muscle spasms.   ergocalciferol (DRISDOL) 1.25 MG (50000 UT) capsule Take 1 capsule (50,000 Units total) by mouth once a week.   etodolac (LODINE) 400 MG tablet Take 1 tablet (400 mg total) by mouth 2 (two) times daily.   etonogestrel-ethinyl estradiol (NUVARING) 0.12-0.015 MG/24HR vaginal ring    lidocaine (LIDODERM) 5 % Place 1 patch onto the skin daily. Remove & Discard patch within 12 hours or as directed by MD   meloxicam (MOBIC) 15 MG tablet Take 1 tablet (15 mg total) by mouth daily.   methylPREDNISolone (MEDROL) 4 MG TBPK tablet Take by mouth as directed for 6 days   Semaglutide,0.25 or 0.5MG /DOS, (OZEMPIC, 0.25 OR 0.5 MG/DOSE,) 2 MG/3ML SOPN Inject 0.5 mg into the skin once a week.   [DISCONTINUED] Insulin Pen Needle 29G X MISC To use daily with saxenda injections   [DISCONTINUED] Rimegepant Sulfate (NURTEC) 75 MG TBDP Take 1 tablet by mouth x 1 dose for acute headache. (Max 75 mg/day)   [DISCONTINUED] topiramate (TOPAMAX) 25 MG tablet TAKE 1 TABLET BY MOUTH AT BEDTIME FOR 7 DAYS THEN 1 TABLET TWICE DAILY AS TOLERATED .   No facility-administered medications prior to visit.     Review of Systems  {Labs (Optional):23779}   Objective    BP 103/67   Pulse 74   Ht 5' 1.81" (1.57 m)   Wt 214 lb 6.4 oz (97.3 kg)   SpO2 98%   BMI 39.45 kg/m  BP Readings from Last 3 Encounters:  03/04/22 103/67  02/19/22 114/80  02/04/22 101/66    Wt Readings from Last 3 Encounters:  03/04/22 214 lb 6.4 oz (97.3 kg)  02/04/22 218 lb 6.4 oz (99.1 kg)  12/31/21 219 lb 14.4 oz (99.7 kg)    Physical Exam  ***  No results found for any visits on 03/04/22.  Assessment & Plan     Problem List Items Addressed This Visit   None    No follow-ups on file.         Carlean Jews, NP  [facility] at [facility] [phone] (phone) [phone] (fax)  [facility]

## 2022-03-04 ENCOUNTER — Ambulatory Visit (INDEPENDENT_AMBULATORY_CARE_PROVIDER_SITE_OTHER): Payer: BC Managed Care – PPO | Admitting: Nurse Practitioner

## 2022-03-04 ENCOUNTER — Encounter: Payer: Self-pay | Admitting: Nurse Practitioner

## 2022-03-04 VITALS — BP 103/67 | HR 74 | Ht 61.81 in | Wt 214.4 lb

## 2022-03-04 DIAGNOSIS — Z6839 Body mass index (BMI) 39.0-39.9, adult: Secondary | ICD-10-CM | POA: Diagnosis not present

## 2022-03-04 DIAGNOSIS — G8929 Other chronic pain: Secondary | ICD-10-CM | POA: Diagnosis not present

## 2022-03-04 DIAGNOSIS — M545 Low back pain, unspecified: Secondary | ICD-10-CM | POA: Diagnosis not present

## 2022-03-06 ENCOUNTER — Ambulatory Visit: Payer: BC Managed Care – PPO | Admitting: Physical Therapy

## 2022-03-07 ENCOUNTER — Encounter: Payer: Self-pay | Admitting: Nurse Practitioner

## 2022-03-15 ENCOUNTER — Ambulatory Visit: Payer: BC Managed Care – PPO | Attending: Physical Medicine and Rehabilitation

## 2022-03-15 DIAGNOSIS — M545 Low back pain, unspecified: Secondary | ICD-10-CM | POA: Insufficient documentation

## 2022-03-15 DIAGNOSIS — M7918 Myalgia, other site: Secondary | ICD-10-CM | POA: Diagnosis not present

## 2022-03-15 DIAGNOSIS — R293 Abnormal posture: Secondary | ICD-10-CM | POA: Insufficient documentation

## 2022-03-15 DIAGNOSIS — M6281 Muscle weakness (generalized): Secondary | ICD-10-CM | POA: Insufficient documentation

## 2022-03-15 DIAGNOSIS — G8929 Other chronic pain: Secondary | ICD-10-CM | POA: Insufficient documentation

## 2022-03-15 DIAGNOSIS — M5459 Other low back pain: Secondary | ICD-10-CM | POA: Insufficient documentation

## 2022-03-15 NOTE — Therapy (Signed)
OUTPATIENT PHYSICAL THERAPY THORACOLUMBAR EVALUATION   Patient Name: Diane Marshall MRN: [id number] DOB:21-Nov-2000, 21 y.o., female Today's Date: 03/15/2022   PT End of Session - 03/15/22 1110     Visit Number 1    Number of Visits 17    Date for PT Re-Evaluation 05/11/22    Authorization Type BCBS primary; MCD secondary- no auth required    PT Start Time 1100    PT Stop Time 1136    PT Time Calculation (min) 36 min    Activity Tolerance Patient tolerated treatment well    Behavior During Therapy WFL for tasks assessed/performed             Past Medical History:  Diagnosis Date   Abdominal pain    Encopresis    Loose stools    Past Surgical History:  Procedure Laterality Date   NO PAST SURGERIES     Patient Active Problem List   Diagnosis Date Noted   Chronic midline low back pain without sciatica 02/17/2022   Acute migraine 12/02/2021   Neck pain of over 3 months duration 12/02/2021   Chronic migraine with aura 10/24/2021   Chronic otitis media 10/11/2021   Vitamin D deficiency 10/11/2021   Hyperlipidemia with target LDL less than 100 10/11/2021   Body mass index (BMI) of 40.1-44.9 in adult (HCC) 09/27/2021   Other fatigue 09/27/2021   BMI 39.0-39.9,adult 09/01/2019   Cough in adult 07/01/2019   Normal labor 04/14/2019   Urinary frequency 09/29/2018   Healthcare maintenance 05/06/2018   Lower abdominal pain    Encopresis    Alternating constipation and diarrhea     PCP: Carlean Jews, NP  REFERRING PROVIDER: Juanda Chance, NP  REFERRING DIAG:  M54.50,G89.29 (ICD-10-CM) - Chronic bilateral low back pain without sciatica  M79.18 (ICD-10-CM) - Myofascial pain syndrome    Rationale for Evaluation and Treatment Rehabilitation  THERAPY DIAG:  Other low back pain  Muscle weakness (generalized)  Abnormal posture  ONSET DATE: chronic   SUBJECTIVE:                                                                                                                                                                                            SUBJECTIVE STATEMENT: Patient reports constant back pain in the middle of her lower back. She reports this has been going on for long time, at least 5-6 years, but has worsened since beginning her job at [employer] about 2 months ago. She is unsure of any specific injury to her back; just has gradually worsened. She denies any numbness/tingling and no pain about BLEs. She denies any  changes in bowel or bladder.   PERTINENT HISTORY:  None   PAIN:  Are you having pain? Yes: NPRS scale: 2 currently at worst 10/10 Pain location: middle of low back Pain description: dull Aggravating factors: constant bending, lifting, prolonged standing Relieving factors: OTC medication and rest    PRECAUTIONS: None  WEIGHT BEARING RESTRICTIONS No  FALLS:  Has patient fallen in last 6 months? No  LIVING ENVIRONMENT: Lives with: lives with their family Lives in: Mobile home Stairs: No Has following equipment at home: None  OCCUPATION: [employer] (lifting and standing)   PLOF: Independent  PATIENT GOALS "things to help my back"   OBJECTIVE:   DIAGNOSTIC FINDINGS:  Radiographs of lumbar spine reviewed. Normal anatomic alignment with no  listhesis. Lumbarized S1. Minimal degenerative changes noted to lower  lumbar region. No fractures/dislocations noted.   PATIENT SURVEYS:  Modified Oswestry 14% disability    SCREENING FOR RED FLAGS: Bowel or bladder incontinence: No Spinal tumors: No Cauda equina syndrome: No Compression fracture: No Abdominal aneurysm: No  COGNITION:  Overall cognitive status: Within functional limits for tasks assessed     SENSATION: Not tested  MUSCLE LENGTH: Hamstrings: Right lacking 15 deg; Left lacking 30 deg   POSTURE: anterior pelvic tilt  PALPATION: L-spine PAIVM hypermobile and painful Diffuse tenderness about lumbar paraspinals   LUMBAR ROM:    Active  A/PROM  eval  Flexion WNL pn!  Extension WNL  Right lateral flexion WNL  Left lateral flexion WNL  Right rotation WNL  Left rotation WNL   (Blank rows = not tested)  LOWER EXTREMITY ROM:     Active  Right eval Left eval  Hip flexion    Hip extension    Hip abduction    Hip adduction    Hip internal rotation    Hip external rotation    Knee flexion    Knee extension    Ankle dorsiflexion    Ankle plantarflexion    Ankle inversion    Ankle eversion     (Blank rows = not tested)  LOWER EXTREMITY MMT:    MMT Right eval Left eval  Hip flexion 4 4  Hip extension (DELAYED GLUTEAL FIRING BILATERALLY) 4 4  Hip abduction 4 4-  Hip adduction    Hip internal rotation    Hip external rotation    Knee flexion    Knee extension    Ankle dorsiflexion    Ankle plantarflexion    Ankle inversion    Core 2   (Blank rows = not tested)  LUMBAR SPECIAL TESTS:  SLR (+) LLE   FUNCTIONAL TESTS:  Squat: excessive trunk flexion   GAIT: Distance walked: 10 ft  Assistive device utilized: None Level of assistance: Complete Independence Comments: WNL    TODAY'S TREATMENT  [facility] Adult PT Treatment:                                                DATE: 03/15/22 Therapeutic Exercise: Demonstrated and issue initial HEP.   Therapeutic Activity: Education on assessment findings that will be addressed throughout duration of POC.     PATIENT EDUCATION:  Education details: see treatment  Person educated: Patient Education method: Explanation, Demonstration, Tactile cues, Verbal cues, and Handouts Education comprehension: verbalized understanding, returned demonstration, verbal cues required, tactile cues required, and needs further education   HOME  EXERCISE PROGRAM: Access Code: [email or web address] URL: [email or web address] Date: 03/15/2022 Prepared by: Letitia Libra  Exercises - Seated Hamstring Stretch  - 2 x daily - 7 x weekly - 3 sets - 30 sec  hold -  Supine Quadriceps Stretch with Strap on Table  - 2 x daily - 7 x weekly - 3 sets - 30 sec  hold - Prone Hip Extension  - 1 x daily - 7 x weekly - 2 sets - 10 reps - Supine Posterior Pelvic Tilt  - 1 x daily - 7 x weekly - 2 sets - 10 reps - 5 sec  hold  ASSESSMENT:  CLINICAL IMPRESSION: Patient is a 21 y.o. female who was seen today for physical therapy evaluation and treatment for chronic low back pain of insidious onset. She is noted to have hypermobility about her L-spine, core and hip weakness, and aberrant lifting mechanics. She will benefit from skilled PT to address the above stated deficits in order to optimize her function and reduce her pain.    OBJECTIVE IMPAIRMENTS decreased activity tolerance, difficulty walking, decreased strength, improper body mechanics, postural dysfunction, and pain.   ACTIVITY LIMITATIONS carrying, lifting, bending, standing, squatting, and locomotion level  PARTICIPATION LIMITATIONS: shopping, community activity, and occupation  PERSONAL FACTORS Fitness, Profession, and Time since onset of injury/illness/exacerbation are also affecting patient's functional outcome.   REHAB POTENTIAL: Good  CLINICAL DECISION MAKING: Stable/uncomplicated  EVALUATION COMPLEXITY: Low   GOALS: Goals reviewed with patient? Yes  SHORT TERM GOALS: Target date: 04/12/22  Patient will be independent and compliant with initial HEP.   Baseline: issued at eval  Goal status: INITIAL  2.  Patient will demonstrate proper gluteal firing with prone hip extension to reduce over-activation of her lumbar erectors when lifting heavy items.  Baseline: delayed activation  Goal status: INITIAL  3.  Patient will improve Lt hamstring flexibility by at least 10 degrees to reduce stress on her back with bending and reaching tasks.  Baseline: see above  Goal status: INITIAL  4.  Patient will report pain at worst rated as </=7/10 indicative of improvements in her current condition.   Baseline: 10/10 Goal status: INITIAL  LONG TERM GOALS: Target date: 05/10/22  Patient will be able to deadlift at least 45 pounds with proper form and no increase in back pain.  Baseline: poor lifting mechanics; increased pain with heavy lifting  Goal status: INITIAL  2.  Patient will demonstrate at least 3/5 core strength to improve overall lumbopelvic stability.  Baseline: 2/5 Goal status: INITIAL  3.  Patient will demonstrate at least 4+/5 bilateral hip strength to improve stability about the chain with prolonged walking activity.  Baseline: see above Goal status: INITIAL  4.  Patient will demonstrate pain free trunk AROM to improve ability to complete bending and reaching activity.  Baseline: see above Goal status: INITIAL  5.  Patient will report pain at worst </= 5/10 to improve her tolerance to work specific tasks.  Baseline: 10/10 Goal status: INITIAL   PLAN: PT FREQUENCY: 1-2x/week  PT DURATION: 6-8 weeks  PLANNED INTERVENTIONS: Therapeutic exercises, Therapeutic activity, Neuromuscular re-education, Balance training, Patient/Family education, Self Care, Dry Needling, Electrical stimulation, Cryotherapy, Moist heat, Manual therapy, and Re-evaluation.  PLAN FOR NEXT SESSION: review HEP, core stabilization, hip strengthening   Letitia Libra, PT, DPT, ATC 03/15/22 1:52 PM

## 2022-04-02 ENCOUNTER — Ambulatory Visit: Payer: BC Managed Care – PPO | Admitting: Physical Medicine and Rehabilitation

## 2022-04-18 ENCOUNTER — Other Ambulatory Visit: Payer: Self-pay | Admitting: Nurse Practitioner

## 2022-04-18 DIAGNOSIS — R7301 Impaired fasting glucose: Secondary | ICD-10-CM

## 2022-04-18 DIAGNOSIS — Z6841 Body Mass Index (BMI) 40.0 and over, adult: Secondary | ICD-10-CM

## 2022-04-29 ENCOUNTER — Ambulatory Visit: Payer: BC Managed Care – PPO

## 2022-05-02 ENCOUNTER — Ambulatory Visit: Payer: BC Managed Care – PPO | Admitting: Nurse Practitioner

## 2022-05-15 DIAGNOSIS — M791 Myalgia, unspecified site: Secondary | ICD-10-CM | POA: Diagnosis not present

## 2022-05-15 DIAGNOSIS — J029 Acute pharyngitis, unspecified: Secondary | ICD-10-CM | POA: Diagnosis not present

## 2022-05-15 DIAGNOSIS — B349 Viral infection, unspecified: Secondary | ICD-10-CM | POA: Diagnosis not present

## 2022-05-15 DIAGNOSIS — R519 Headache, unspecified: Secondary | ICD-10-CM | POA: Diagnosis not present

## 2022-06-27 ENCOUNTER — Other Ambulatory Visit: Payer: Self-pay | Admitting: Nurse Practitioner

## 2022-06-27 DIAGNOSIS — R7301 Impaired fasting glucose: Secondary | ICD-10-CM

## 2022-06-27 DIAGNOSIS — Z6841 Body Mass Index (BMI) 40.0 and over, adult: Secondary | ICD-10-CM

## 2022-06-30 ENCOUNTER — Encounter: Payer: Self-pay | Admitting: Nurse Practitioner

## 2022-07-22 HISTORY — PX: WISDOM TOOTH EXTRACTION: SHX21

## 2022-07-31 ENCOUNTER — Other Ambulatory Visit: Payer: Self-pay | Admitting: Nurse Practitioner

## 2022-07-31 DIAGNOSIS — R7301 Impaired fasting glucose: Secondary | ICD-10-CM

## 2022-07-31 DIAGNOSIS — Z6841 Body Mass Index (BMI) 40.0 and over, adult: Secondary | ICD-10-CM

## 2022-10-21 ENCOUNTER — Ambulatory Visit: Payer: BC Managed Care – PPO | Admitting: Nurse Practitioner

## 2022-11-01 ENCOUNTER — Other Ambulatory Visit: Payer: Self-pay | Admitting: Nurse Practitioner

## 2022-11-01 DIAGNOSIS — R7301 Impaired fasting glucose: Secondary | ICD-10-CM

## 2022-11-01 DIAGNOSIS — Z6841 Body Mass Index (BMI) 40.0 and over, adult: Secondary | ICD-10-CM

## 2023-05-02 ENCOUNTER — Telehealth: Payer: Self-pay

## 2023-05-02 NOTE — Telephone Encounter (Signed)
LVM to return call to clinic to ask about PT

## 2023-05-02 NOTE — Telephone Encounter (Signed)
-----   Message from [place], [place] E sent at 04/14/2023  9:27 AM EDT ----- We can see her back, would you please ask her if she completed regimen of physical therapy? Thanks ----- Message ----- From: Sharlet Salina, CMA Sent: 04/14/2023   9:17 AM EDT To: Tyrell Antonio, MD; Juanda Chance, NP   ----- Message ----- From: Dorcas Mcmurray Sent: 04/14/2023   9:17 AM EDT To: Sharlet Salina, CMA

## 2023-05-21 ENCOUNTER — Telehealth: Payer: Self-pay | Admitting: Physical Medicine and Rehabilitation

## 2023-05-21 NOTE — Telephone Encounter (Signed)
Patient called returning your call. CB#[phone]

## 2023-05-22 NOTE — Telephone Encounter (Signed)
LVM to return call to schedule ov

## 2023-08-12 DIAGNOSIS — Z3201 Encounter for pregnancy test, result positive: Secondary | ICD-10-CM | POA: Diagnosis not present

## 2023-09-03 DIAGNOSIS — O3680X9 Pregnancy with inconclusive fetal viability, other fetus: Secondary | ICD-10-CM | POA: Diagnosis not present

## 2023-09-03 DIAGNOSIS — Z3A01 Less than 8 weeks gestation of pregnancy: Secondary | ICD-10-CM | POA: Diagnosis not present

## 2023-09-11 DIAGNOSIS — Z3A01 Less than 8 weeks gestation of pregnancy: Secondary | ICD-10-CM | POA: Diagnosis not present

## 2023-09-11 DIAGNOSIS — O200 Threatened abortion: Secondary | ICD-10-CM | POA: Diagnosis not present

## 2023-09-11 DIAGNOSIS — O021 Missed abortion: Secondary | ICD-10-CM | POA: Diagnosis not present

## 2023-09-14 DIAGNOSIS — Z79899 Other long term (current) drug therapy: Secondary | ICD-10-CM | POA: Diagnosis not present

## 2023-09-14 DIAGNOSIS — R55 Syncope and collapse: Secondary | ICD-10-CM | POA: Diagnosis not present

## 2023-09-14 DIAGNOSIS — O021 Missed abortion: Secondary | ICD-10-CM | POA: Diagnosis not present

## 2023-09-14 DIAGNOSIS — G43909 Migraine, unspecified, not intractable, without status migrainosus: Secondary | ICD-10-CM | POA: Diagnosis not present

## 2023-09-14 DIAGNOSIS — Z7985 Long-term (current) use of injectable non-insulin antidiabetic drugs: Secondary | ICD-10-CM | POA: Diagnosis not present

## 2023-09-14 DIAGNOSIS — F419 Anxiety disorder, unspecified: Secondary | ICD-10-CM | POA: Diagnosis not present

## 2023-09-14 HISTORY — DX: Syncope and collapse: R55

## 2023-09-17 DIAGNOSIS — O021 Missed abortion: Secondary | ICD-10-CM | POA: Diagnosis not present

## 2023-09-18 ENCOUNTER — Encounter: Payer: Self-pay | Admitting: Student

## 2023-09-18 NOTE — H&P (Signed)
 Diane Marshall is an 23 y.o. female presenting for scheduled procedure.   She has a history of a missed abortion diagnosed on 2/20 after fetal heart tones which were previously seen were then absent. She took misoprostol on 2/22. Follow-up ultrasound on 2/26 showed retained products with EMT 32 mm with increased vascularity.   Pertinent Gynecological History: Blood transfusions: none Sexually transmitted diseases: no past history Previous GYN Procedures:  None   Last pap:  never OB History: G3, P1021   Menstrual History: LMP: 06/30/23 No LMP recorded. (Menstrual status: Other).    Past Medical History:  Diagnosis Date   Abdominal pain    Back pain    Encopresis    Loose stools     Past Surgical History:  Procedure Laterality Date   ear procedure     NO PAST SURGERIES      Family History  Problem Relation Age of Onset   Lactose intolerance Sister    Hypertension Sister    Hypertension Sister    Lactose intolerance Father    Hypertension Father    Cholelithiasis Paternal Grandmother    Cancer Paternal Grandmother        colon   Hypertension Paternal Grandmother    Heart attack Maternal Grandfather    Celiac disease Neg Hx    Ulcers Neg Hx    Irritable bowel syndrome Neg Hx     Social History:  reports that she has never smoked. She has never used smokeless tobacco. She reports that she does not drink alcohol and does not use drugs.  Allergies: No Known Allergies  No medications prior to admission.    Review of Systems  Constitutional:  Negative for chills and fever.  Respiratory:  Negative for chest tightness and shortness of breath.   Cardiovascular:  Negative for chest pain.  Gastrointestinal:  Negative for abdominal pain and constipation.  Genitourinary:  Positive for pelvic pain and vaginal bleeding.  Musculoskeletal:  Negative for gait problem.  Neurological:  Negative for light-headedness and headaches.    There were no vitals taken for this  visit. Physical Exam Constitutional:      General: She is not in acute distress.    Appearance: Normal appearance.  HENT:     Head: Normocephalic and atraumatic.  Pulmonary:     Effort: Pulmonary effort is normal.  Musculoskeletal:        General: Normal range of motion.  Skin:    General: Skin is warm and dry.  Neurological:     Mental Status: She is alert.  Psychiatric:        Mood and Affect: Mood normal.        Behavior: Behavior normal.     No results found for this or any previous visit (from the past 24 hours).  No results found.  Assessment/Plan: 23 yo female with incomplete AB here for surgical completion with D&C  - NPO  - IVF - To OR when ready  Junius Creamer 09/18/2023, 10:36 AM

## 2023-09-18 NOTE — Progress Notes (Signed)
 Spoke w/ via phone for pre-op interview--- pt Lab needs dos---- cbc, t&s,  hCG quantitative preg        Lab results------ no COVID test -----patient states asymptomatic no test needed Arrive at ------- 0700 on 09-19-2023 NPO after MN  Pre-Surgery Ensure or G2:  n/a  Med rec completed Medications to take morning of surgery -----  none Diabetic medication -----  n/a  GLP1 agonist last dose:  n/a GLP1 instructions:  Patient instructed no nail polish to be worn day of surgery Patient instructed to bring photo id and insurance card day of surgery Patient aware to have Driver (ride ) / caregiver    for 24 hours after surgery - mother, Diane Marshall Patient Special Instructions -----  n/a Pre-Op special Instructions ----- n/a  Patient verbalized understanding of instructions that were given at this phone interview. Patient denies chest pain, sob, fever, cough at the interview.

## 2023-09-18 NOTE — Anesthesia Preprocedure Evaluation (Signed)
 Anesthesia Evaluation  Patient identified by MRN, date of birth, ID band Patient awake    Reviewed: Allergy & Precautions, NPO status , Patient's Chart, lab work & pertinent test results  History of Anesthesia Complications Negative for: history of anesthetic complications  Airway Mallampati: II  TM Distance: >3 FB Neck ROM: Full    Dental no notable dental hx.    Pulmonary neg pulmonary ROS   Pulmonary exam normal        Cardiovascular negative cardio ROS Normal cardiovascular exam     Neuro/Psych negative neurological ROS  negative psych ROS   GI/Hepatic negative GI ROS, Neg liver ROS,,,  Endo/Other  BMI 35  Renal/GU negative Renal ROS  negative genitourinary   Musculoskeletal negative musculoskeletal ROS (+)    Abdominal   Peds  Hematology negative hematology ROS (+)   Anesthesia Other Findings Day of surgery medications reviewed with patient.  Reproductive/Obstetrics Missed Ab                              Anesthesia Physical Anesthesia Plan  ASA: 2  Anesthesia Plan: General   Post-op Pain Management: Tylenol PO (pre-op)* and Toradol IV (intra-op)*   Induction: Intravenous  PONV Risk Score and Plan: 3 and Midazolam, Treatment may vary due to age or medical condition, Scopolamine patch - Pre-op, Dexamethasone and Ondansetron  Airway Management Planned: LMA  Additional Equipment: None  Intra-op Plan:   Post-operative Plan: Extubation in OR  Informed Consent: I have reviewed the patients History and Physical, chart, labs and discussed the procedure including the risks, benefits and alternatives for the proposed anesthesia with the patient or authorized representative who has indicated his/her understanding and acceptance.     Dental advisory given  Plan Discussed with: CRNA  Anesthesia Plan Comments:         Anesthesia Quick Evaluation

## 2023-09-19 ENCOUNTER — Ambulatory Visit (HOSPITAL_COMMUNITY)
Admission: RE | Admit: 2023-09-19 | Discharge: 2023-09-19 | Disposition: A | Discharge status: Home / Self Care | Payer: BC Managed Care – PPO | Attending: Student | Admitting: Student

## 2023-09-19 ENCOUNTER — Other Ambulatory Visit: Payer: Self-pay

## 2023-09-19 ENCOUNTER — Ambulatory Visit (HOSPITAL_COMMUNITY): Payer: Self-pay | Admitting: Anesthesiology

## 2023-09-19 ENCOUNTER — Encounter (HOSPITAL_COMMUNITY)
Admission: RE | Disposition: A | Discharge status: Home / Self Care | Payer: Self-pay | Source: Home / Self Care | Attending: Student

## 2023-09-19 ENCOUNTER — Encounter (HOSPITAL_COMMUNITY): Payer: Self-pay | Admitting: Student

## 2023-09-19 DIAGNOSIS — O034 Incomplete spontaneous abortion without complication: Secondary | ICD-10-CM | POA: Diagnosis not present

## 2023-09-19 DIAGNOSIS — O0339 Incomplete spontaneous abortion with other complications: Secondary | ICD-10-CM | POA: Diagnosis not present

## 2023-09-19 DIAGNOSIS — Z01818 Encounter for other preprocedural examination: Secondary | ICD-10-CM

## 2023-09-19 HISTORY — PX: DILATION AND EVACUATION: SHX1459

## 2023-09-19 HISTORY — DX: Incomplete spontaneous abortion without complication: O03.4

## 2023-09-19 HISTORY — DX: Presence of spectacles and contact lenses: Z97.3

## 2023-09-19 HISTORY — DX: Vitamin D deficiency, unspecified: E55.9

## 2023-09-19 HISTORY — DX: Other seasonal allergic rhinitis: J30.2

## 2023-09-19 HISTORY — DX: Anxiety disorder, unspecified: F41.9

## 2023-09-19 HISTORY — DX: Migraine, unspecified, not intractable, without status migrainosus: G43.909

## 2023-09-19 HISTORY — DX: Personal history of other mental and behavioral disorders: Z86.59

## 2023-09-19 LAB — CBC
HCT: 27.5 % — ABNORMAL LOW (ref 36.0–46.0)
Hemoglobin: 8.8 g/dL — ABNORMAL LOW (ref 12.0–15.0)
MCH: 29 pg (ref 26.0–34.0)
MCHC: 32 g/dL (ref 30.0–36.0)
MCV: 90.8 fL (ref 80.0–100.0)
Platelets: 330 10*3/uL (ref 150–400)
RBC: 3.03 MIL/uL — ABNORMAL LOW (ref 3.87–5.11)
RDW: 12.8 % (ref 11.5–15.5)
WBC: 8.3 10*3/uL (ref 4.0–10.5)
nRBC: 0 % (ref 0.0–0.2)

## 2023-09-19 LAB — TYPE AND SCREEN
ABO/RH(D): O POS
Antibody Screen: NEGATIVE

## 2023-09-19 LAB — HCG, QUANTITATIVE, PREGNANCY: hCG, Beta Chain, Quant, S: 8553 m[IU]/mL — ABNORMAL HIGH (ref <5)

## 2023-09-19 SURGERY — DILATION AND EVACUATION, UTERUS
Anesthesia: General | Site: Uterus

## 2023-09-19 MED ORDER — MIDAZOLAM HCL 2 MG/2ML IJ SOLN
INTRAMUSCULAR | Status: DC | PRN
  Administered 2023-09-19: 2 mg via INTRAVENOUS

## 2023-09-19 MED ORDER — METHYLERGONOVINE MALEATE 0.2 MG/ML IJ SOLN
INTRAMUSCULAR | Status: AC
  Filled 2023-09-19: qty 1

## 2023-09-19 MED ORDER — FENTANYL CITRATE (PF) 250 MCG/5ML IJ SOLN
INTRAMUSCULAR | Status: AC
  Filled 2023-09-19: qty 5

## 2023-09-19 MED ORDER — FENTANYL CITRATE (PF) 250 MCG/5ML IJ SOLN
INTRAMUSCULAR | Status: DC | PRN
  Administered 2023-09-19: 100 ug via INTRAVENOUS

## 2023-09-19 MED ORDER — ORAL CARE MOUTH RINSE: 15.0000 mL | Freq: Once | OROMUCOSAL | Status: AC

## 2023-09-19 MED ORDER — ACETAMINOPHEN 500 MG PO TABS
ORAL_TABLET | ORAL | Status: AC
  Filled 2023-09-19: qty 2

## 2023-09-19 MED ORDER — POVIDONE-IODINE 10 % EX SWAB: 2.0000 | Freq: Once | CUTANEOUS | Status: DC

## 2023-09-19 MED ORDER — TRANEXAMIC ACID-NACL 1000-0.7 MG/100ML-% IV SOLN
INTRAVENOUS | Status: AC
  Filled 2023-09-19: qty 100

## 2023-09-19 MED ORDER — LIDOCAINE HCL 1 % IJ SOLN
INTRAMUSCULAR | Status: DC | PRN
  Administered 2023-09-19: 10 mL

## 2023-09-19 MED ORDER — CHLORHEXIDINE GLUCONATE 0.12 % MT SOLN
15.0000 mL | Freq: Once | OROMUCOSAL | Status: AC
  Administered 2023-09-19: 15 mL via OROMUCOSAL

## 2023-09-19 MED ORDER — KETOROLAC TROMETHAMINE 30 MG/ML IJ SOLN
INTRAMUSCULAR | Status: DC | PRN
  Administered 2023-09-19: 30 mg via INTRAVENOUS

## 2023-09-19 MED ORDER — FENTANYL CITRATE (PF) 100 MCG/2ML IJ SOLN: 25.0000 ug | INTRAMUSCULAR | Status: DC | PRN

## 2023-09-19 MED ORDER — LIDOCAINE HCL (PF) 1 % IJ SOLN
INTRAMUSCULAR | Status: AC
  Filled 2023-09-19: qty 30

## 2023-09-19 MED ORDER — ACETAMINOPHEN 500 MG PO TABS
1000.0000 mg | ORAL_TABLET | Freq: Once | ORAL | Status: AC
  Administered 2023-09-19: 1000 mg via ORAL

## 2023-09-19 MED ORDER — DROPERIDOL 2.5 MG/ML IJ SOLN: 0.6250 mg | Freq: Once | INTRAMUSCULAR | Status: DC | PRN

## 2023-09-19 MED ORDER — ONDANSETRON HCL 4 MG/2ML IJ SOLN
INTRAMUSCULAR | Status: DC | PRN
  Administered 2023-09-19: 4 mg via INTRAVENOUS

## 2023-09-19 MED ORDER — SCOPOLAMINE 1 MG/3DAYS TD PT72
1.0000 | MEDICATED_PATCH | Freq: Once | TRANSDERMAL | Status: DC
  Administered 2023-09-19: 1.5 mg via TRANSDERMAL

## 2023-09-19 MED ORDER — OXYCODONE HCL 5 MG PO TABS: 5.0000 mg | ORAL_TABLET | Freq: Once | ORAL | Status: DC | PRN

## 2023-09-19 MED ORDER — IBUPROFEN 600 MG PO TABS: 600.0000 mg | ORAL_TABLET | Freq: Four times a day (QID) | ORAL | 0 refills | Status: AC | PRN

## 2023-09-19 MED ORDER — PROPOFOL 10 MG/ML IV BOLUS
INTRAVENOUS | Status: DC | PRN
  Administered 2023-09-19: 200 mg via INTRAVENOUS

## 2023-09-19 MED ORDER — OXYCODONE HCL 5 MG/5ML PO SOLN: 5.0000 mg | Freq: Once | ORAL | Status: DC | PRN

## 2023-09-19 MED ORDER — DEXAMETHASONE SODIUM PHOSPHATE 10 MG/ML IJ SOLN
INTRAMUSCULAR | Status: DC | PRN
  Administered 2023-09-19: 10 mg via INTRAVENOUS

## 2023-09-19 MED ORDER — MISOPROSTOL 200 MCG PO TABS
ORAL_TABLET | ORAL | Status: AC
  Filled 2023-09-19: qty 5

## 2023-09-19 MED ORDER — PROPOFOL 10 MG/ML IV BOLUS
INTRAVENOUS | Status: AC
  Filled 2023-09-19: qty 20

## 2023-09-19 MED ORDER — FERROUS SULFATE 325 (65 FE) MG PO TABS: 325.0000 mg | ORAL_TABLET | ORAL | 1 refills | Status: AC

## 2023-09-19 MED ORDER — LACTATED RINGERS IV SOLN
INTRAVENOUS | Status: DC
Start: 2023-09-19 — End: 2023-09-19

## 2023-09-19 MED ORDER — CARBOPROST TROMETHAMINE 250 MCG/ML IM SOLN
INTRAMUSCULAR | Status: AC
  Filled 2023-09-19: qty 1

## 2023-09-19 MED ORDER — DOXYCYCLINE HYCLATE 100 MG PO TABS
200.0000 mg | ORAL_TABLET | Freq: Once | ORAL | Status: AC
  Administered 2023-09-19: 200 mg via ORAL
  Filled 2023-09-19: qty 2

## 2023-09-19 MED ORDER — LIDOCAINE 2% (20 MG/ML) 5 ML SYRINGE
INTRAMUSCULAR | Status: DC | PRN
  Administered 2023-09-19: 100 mg via INTRAVENOUS

## 2023-09-19 MED ORDER — PHENYLEPHRINE HCL (PRESSORS) 10 MG/ML IV SOLN
INTRAVENOUS | Status: DC | PRN
Start: 2023-09-19 — End: 2023-09-19
  Administered 2023-09-19: 80 ug via INTRAVENOUS

## 2023-09-19 MED ORDER — DOXYCYCLINE HYCLATE 100 MG PO TABS
ORAL_TABLET | ORAL | Status: AC
  Filled 2023-09-19: qty 2

## 2023-09-19 MED ORDER — SILVER NITRATE-POT NITRATE 75-25 % EX MISC
CUTANEOUS | Status: AC
  Filled 2023-09-19: qty 10

## 2023-09-19 MED ORDER — MIDAZOLAM HCL 2 MG/2ML IJ SOLN
INTRAMUSCULAR | Status: AC
  Filled 2023-09-19: qty 2

## 2023-09-19 MED ORDER — SCOPOLAMINE 1 MG/3DAYS TD PT72
MEDICATED_PATCH | TRANSDERMAL | Status: AC
  Filled 2023-09-19: qty 1

## 2023-09-19 MED ORDER — CHLORHEXIDINE GLUCONATE 0.12 % MT SOLN
OROMUCOSAL | Status: AC
  Filled 2023-09-19: qty 15

## 2023-09-19 MED ORDER — LACTATED RINGERS IV SOLN: INTRAVENOUS | Status: DC

## 2023-09-19 SURGICAL SUPPLY — 19 items
CATH ROBINSON RED A/P 16FR (CATHETERS) ×2 IMPLANT
FILTER UTR ASPR ASSEMBLY (MISCELLANEOUS) ×2 IMPLANT
GLOVE BIO SURGEON STRL SZ8 (GLOVE) ×2 IMPLANT
GLOVE BIOGEL PI IND STRL 7.0 (GLOVE) ×2 IMPLANT
GOWN STRL REUS W/ TWL LRG LVL3 (GOWN DISPOSABLE) ×4 IMPLANT
HOSE CONNECTING 18IN BERKELEY (TUBING) ×2 IMPLANT
KIT BERKELEY 1ST TRI 3/8 NO TR (MISCELLANEOUS) ×2 IMPLANT
KIT BERKELEY 1ST TRIMESTER 3/8 (MISCELLANEOUS) ×2 IMPLANT
NS IRRIG 1000ML POUR BTL (IV SOLUTION) ×2 IMPLANT
PACK VAGINAL MINOR WOMEN LF (CUSTOM PROCEDURE TRAY) ×2 IMPLANT
PAD OB MATERNITY 11 LF (PERSONAL CARE ITEMS) ×2 IMPLANT
SET BERKELEY SUCTION TUBING (SUCTIONS) ×2 IMPLANT
SPIKE FLUID TRANSFER (MISCELLANEOUS) ×2 IMPLANT
TOWEL GREEN STERILE FF (TOWEL DISPOSABLE) ×4 IMPLANT
UNDERPAD 30X36 HEAVY ABSORB (UNDERPADS AND DIAPERS) ×2 IMPLANT
VACURETTE 10 RIGID CVD (CANNULA) IMPLANT
VACURETTE 7MM CVD STRL WRAP (CANNULA) IMPLANT
VACURETTE 8 RIGID CVD (CANNULA) IMPLANT
VACURETTE 9 RIGID CVD (CANNULA) IMPLANT

## 2023-09-19 NOTE — Discharge Instructions (Addendum)
 Call office with any concerns [phone]   No acetaminophen/Tylenol until after 2:15 pm today if needed.     Post Anesthesia Home Care Instructions  Activity: Get plenty of rest for the remainder of the day. A responsible individual must stay with you for 24 hours following the procedure.  For the next 24 hours, DO NOT: -Drive a car -Advertising copywriter -Drink alcoholic beverages -Take any medication unless instructed by your physician -Make any legal decisions or sign important papers.  Meals: Start with liquid foods such as gelatin or soup. Progress to regular foods as tolerated. Avoid greasy, spicy, heavy foods. If nausea and/or vomiting occur, drink only clear liquids until the nausea and/or vomiting subsides. Call your physician if vomiting continues.  Special Instructions/Symptoms: Your throat may feel dry or sore from the anesthesia or the breathing tube placed in your throat during surgery. If this causes discomfort, gargle with warm salt water. The discomfort should disappear within 24 hours.  If you had a scopolamine patch placed behind your ear for the management of post- operative nausea and/or vomiting:  1. The medication in the patch is effective for 72 hours, after which it should be removed.  Wrap patch in a tissue and discard in the trash. Wash hands thoroughly with soap and water. 2. You may remove the patch earlier than 72 hours if you experience unpleasant side effects which may include dry mouth, dizziness or visual disturbances. 3. Avoid touching the patch. Wash your hands with soap and water after contact with the patch.

## 2023-09-19 NOTE — Op Note (Signed)
  Operative Note   Pre-Operative Diagnosis: Incomplete abortion, failed medical management   Postoperative Diagnosis: Incomplete abortion failed medical management   Procedure: Suction dilation and curettage   Surgeon: Truett Perna, DO   Urine output: 50 cc   IV fluids: 600 cc   EBL: 50 cc   Operative Findings: Normal appearing external genitalia, multiparous cervix with closed os, enlarged uterus sounding to 9 cm, products of conception within vac currette   Specimen: products of conception     Patient was brought the operating room where she transferred herself to the OR table. SCDs were placed on her calves and turned on. After adequate anesthesia was achieved, the patient placed in the dorsal lithotomy position in [place] stirrups.  She was prepped and draped in the usual sterile fashion. The bladder was drained using a straight catheter. The bivalve speculum was placed in the vagina and the anterior lip of the cervix grasped with a single-tooth tenaculum.  10cc of 1% lidocaine was administered in a paracervical fashion. The cervix was serially dilated with Hank dilators. . A 9 suction curette was advanced to the fundus, the vacuum was engaged, and multiple suction passes were performed until the products of conception were evacuated. A Sharp curettage was performed and a gritty texture was noted. A final suction pass was performed with minimal results. This completed the procedure. A bimanual massage was performed and confirmed good uterine tone. Hemostasis was seen. All instruments were removed from the vagina.  Pressure and silver nitrate were used to achieve hemostasis at the tenaculum site. The patient tolerated the procedure well was brought to the recovery room in stable condition for the procedure. All sponge and needle counts correct x2.     Truett Perna

## 2023-09-19 NOTE — Interval H&P Note (Signed)
 History and Physical Interval Note:  09/19/2023 8:54 AM  Diane Marshall  has presented today for surgery, with the diagnosis of incomplete abortion.  The various methods of treatment have been discussed with the patient and family. After consideration of risks, benefits and other options for treatment, the patient has consented to  Procedure(s) with comments: DILATATION AND EVACUATION (N/A) [facility] as a surgical intervention.  The patient's history has been reviewed, patient examined, no change in status, stable for surgery.  I have reviewed the patient's chart and labs.  Questions were answered to the patient's satisfaction.     Junius Creamer

## 2023-09-19 NOTE — Anesthesia Procedure Notes (Signed)
 Procedure Name: LMA Insertion Date/Time: 09/19/2023 9:19 AM  Performed by: Venia Carbon, CRNAPre-anesthesia Checklist: Patient identified, Emergency Drugs available, Suction available, Patient being monitored and Timeout performed Patient Re-evaluated:Patient Re-evaluated prior to induction Oxygen Delivery Method: Circle system utilized Preoxygenation: Pre-oxygenation with 100% oxygen Induction Type: IV induction Ventilation: Mask ventilation without difficulty LMA: LMA inserted LMA Size: 4.0 Tube type: Oral Number of attempts: 1 Airway Equipment and Method: Lighted stylet Placement Confirmation: positive ETCO2, CO2 detector and breath sounds checked- equal and bilateral Tube secured with: Tape

## 2023-09-19 NOTE — Anesthesia Postprocedure Evaluation (Signed)
 Anesthesia Post Note  Patient: Diane Marshall  Procedure(s) Performed: DILATATION AND EVACUATION (Uterus)     Patient location during evaluation: PACU Anesthesia Type: General Level of consciousness: awake and alert Pain management: pain level controlled Vital Signs Assessment: post-procedure vital signs reviewed and stable Respiratory status: spontaneous breathing, nonlabored ventilation and respiratory function stable Cardiovascular status: blood pressure returned to baseline Postop Assessment: no apparent nausea or vomiting Anesthetic complications: no   No notable events documented.  Last Vitals:  Vitals:   09/19/23 1000 09/19/23 1015  BP: 113/66   Pulse: 91 88  Resp: (!) 27 (!) 21  Temp:    SpO2: 99% 100%    Last Pain:  Vitals:   09/19/23 0952  TempSrc:   PainSc: 0-No pain                 Shanda Howells

## 2023-09-19 NOTE — Transfer of Care (Signed)
 Immediate Anesthesia Transfer of Care Note  Patient: Diane Marshall  Procedure(s) Performed: DILATATION AND EVACUATION (Uterus)  Patient Location: PACU  Anesthesia Type:General  Level of Consciousness: awake, alert , oriented, and patient cooperative  Airway & Oxygen Therapy: Patient Spontanous Breathing and Patient connected to face mask oxygen  Post-op Assessment: Report given to RN, Post -op Vital signs reviewed and stable, Patient moving all extremities, Patient moving all extremities X 4, and Patient able to stick tongue midline  Post vital signs: Reviewed and stable  Last Vitals:  Vitals Value Taken Time  BP 126/44 09/19/23 0952  Temp 37.1 C 09/19/23 0952  Pulse 95 09/19/23 0956  Resp 27 09/19/23 0956  SpO2 100 % 09/19/23 0956  Vitals shown include unfiled device data.  Last Pain:  Vitals:   09/19/23 0952  TempSrc:   PainSc: 0-No pain      Patients Stated Pain Goal: 5 (09/19/23 1610)  Complications: No notable events documented.

## 2023-09-20 ENCOUNTER — Encounter (HOSPITAL_COMMUNITY): Payer: Self-pay | Admitting: Student

## 2023-09-22 LAB — SURGICAL PATHOLOGY

## 2023-09-22 IMAGING — CR DG CERVICAL SPINE 2 OR 3 VIEWS
4 series · 4 of 4 positions shown · non-contrast
Comparison: None Available.

CLINICAL DATA: Neck pain, radiation to left arm and up into base of
skull.

EXAM:
CERVICAL SPINE - 2-3 VIEW

[w cervical spine lat]
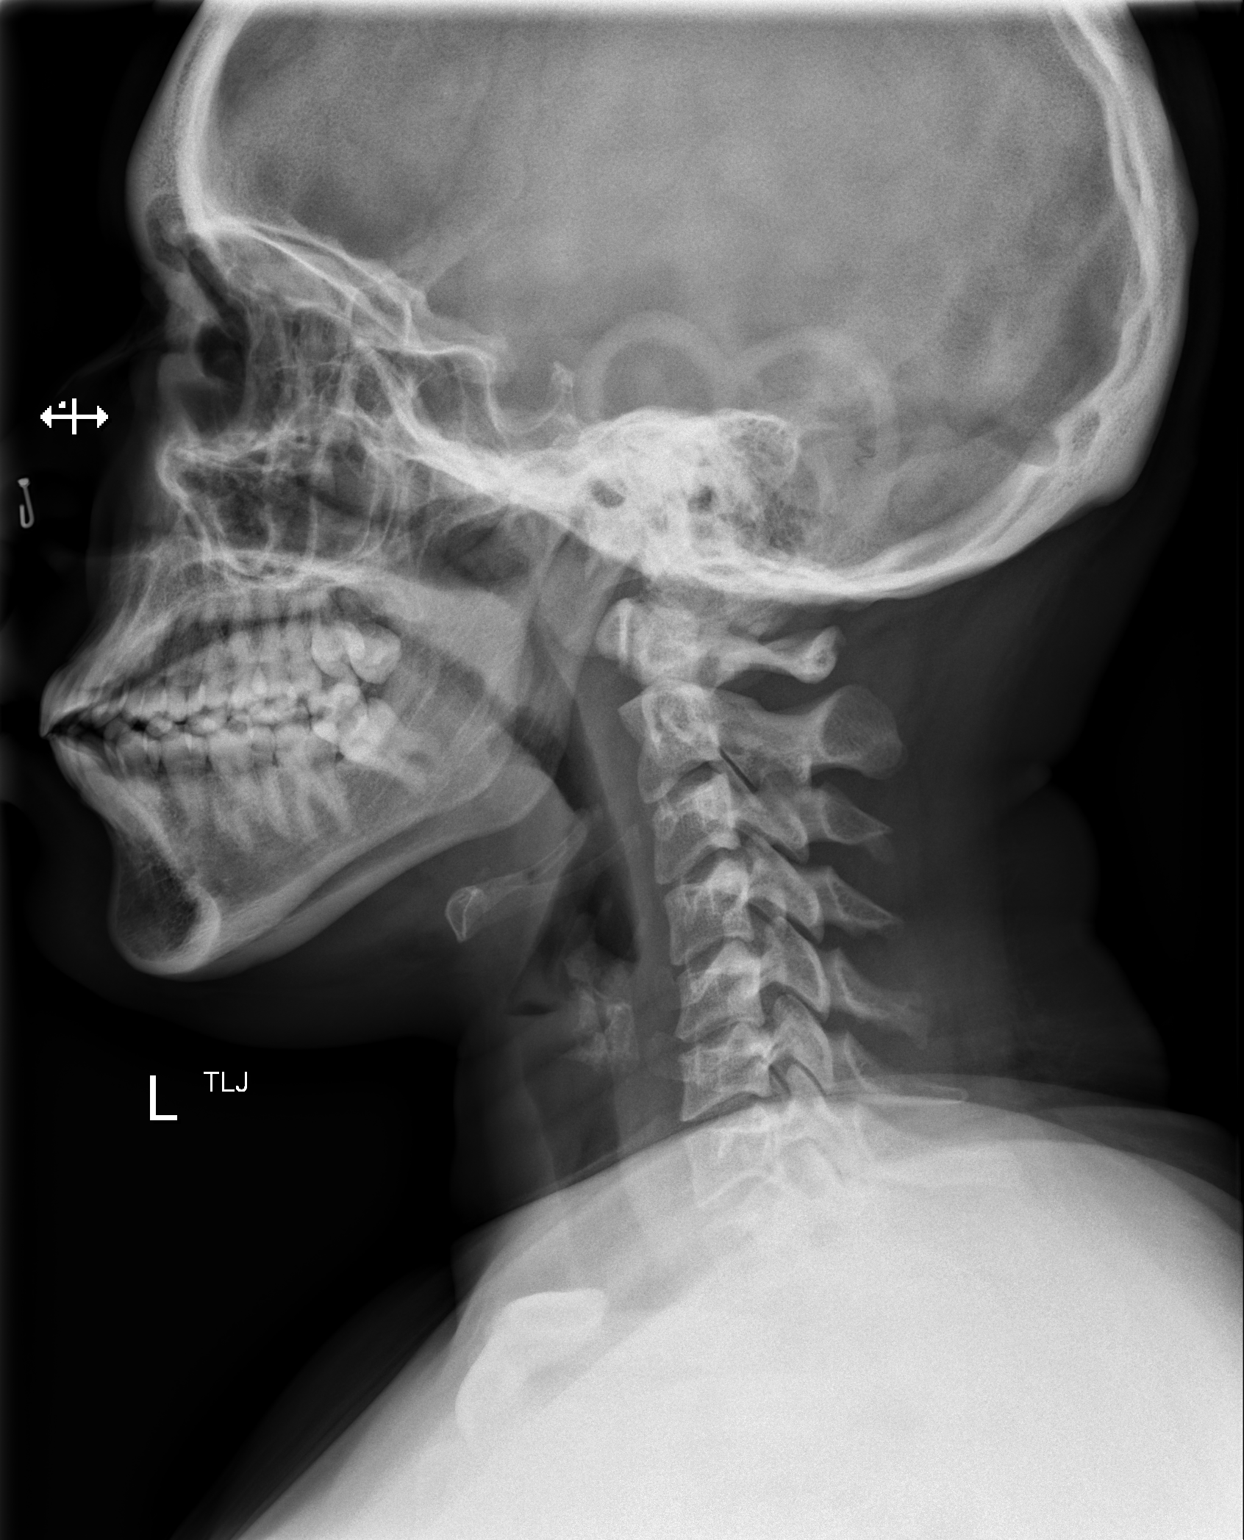

[w cervical spine ap]
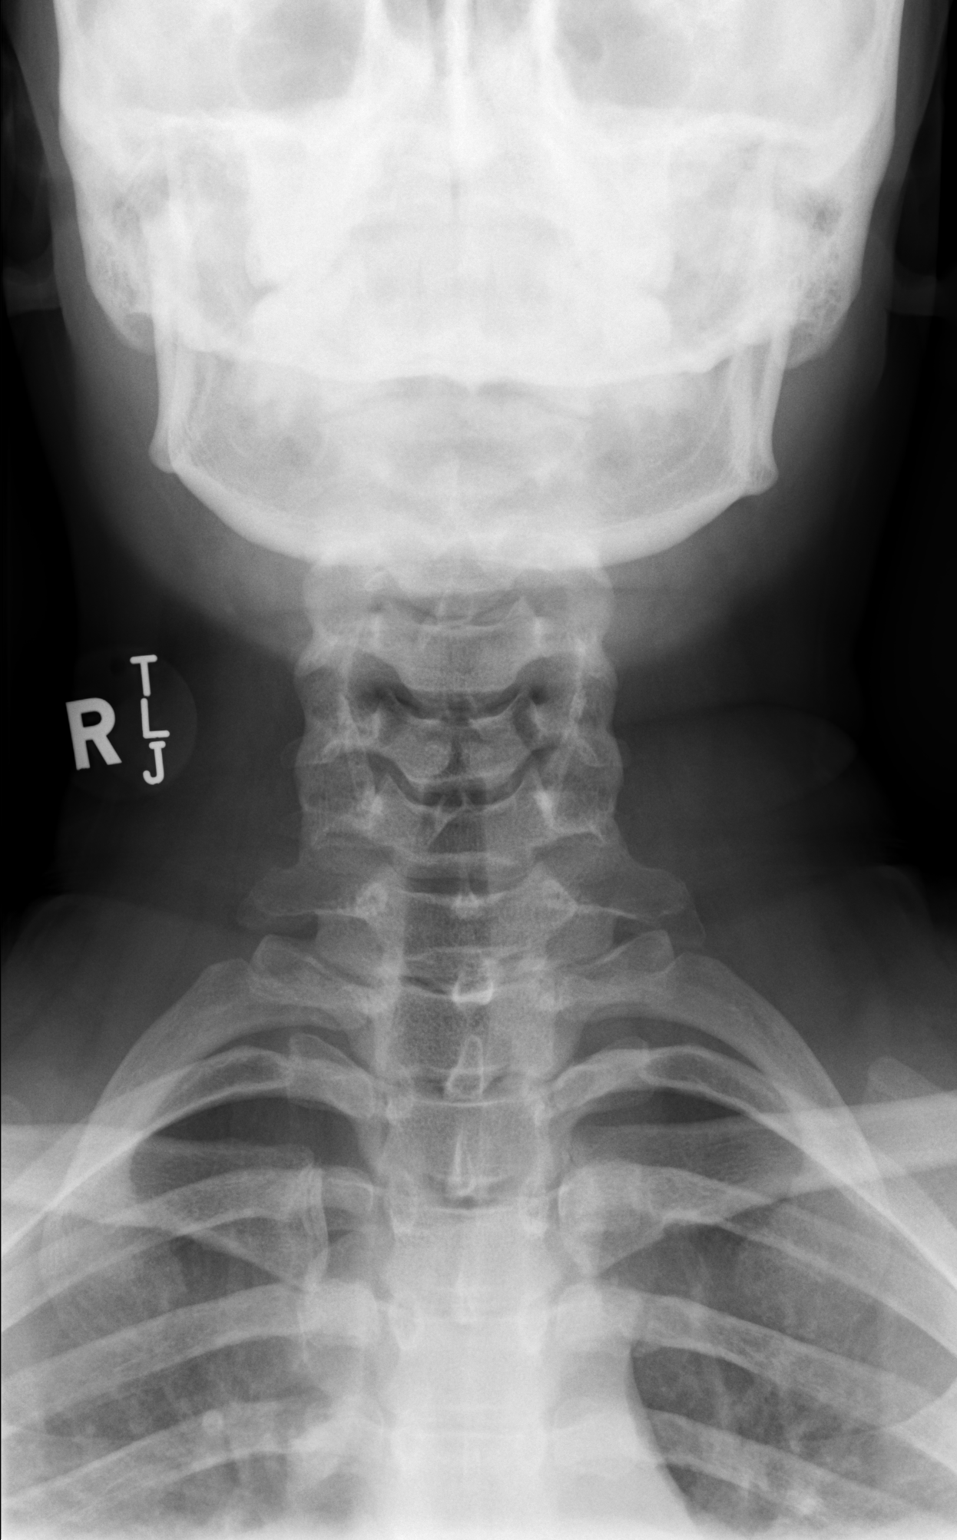

[w cervical spine odontoid (1 of 2)]
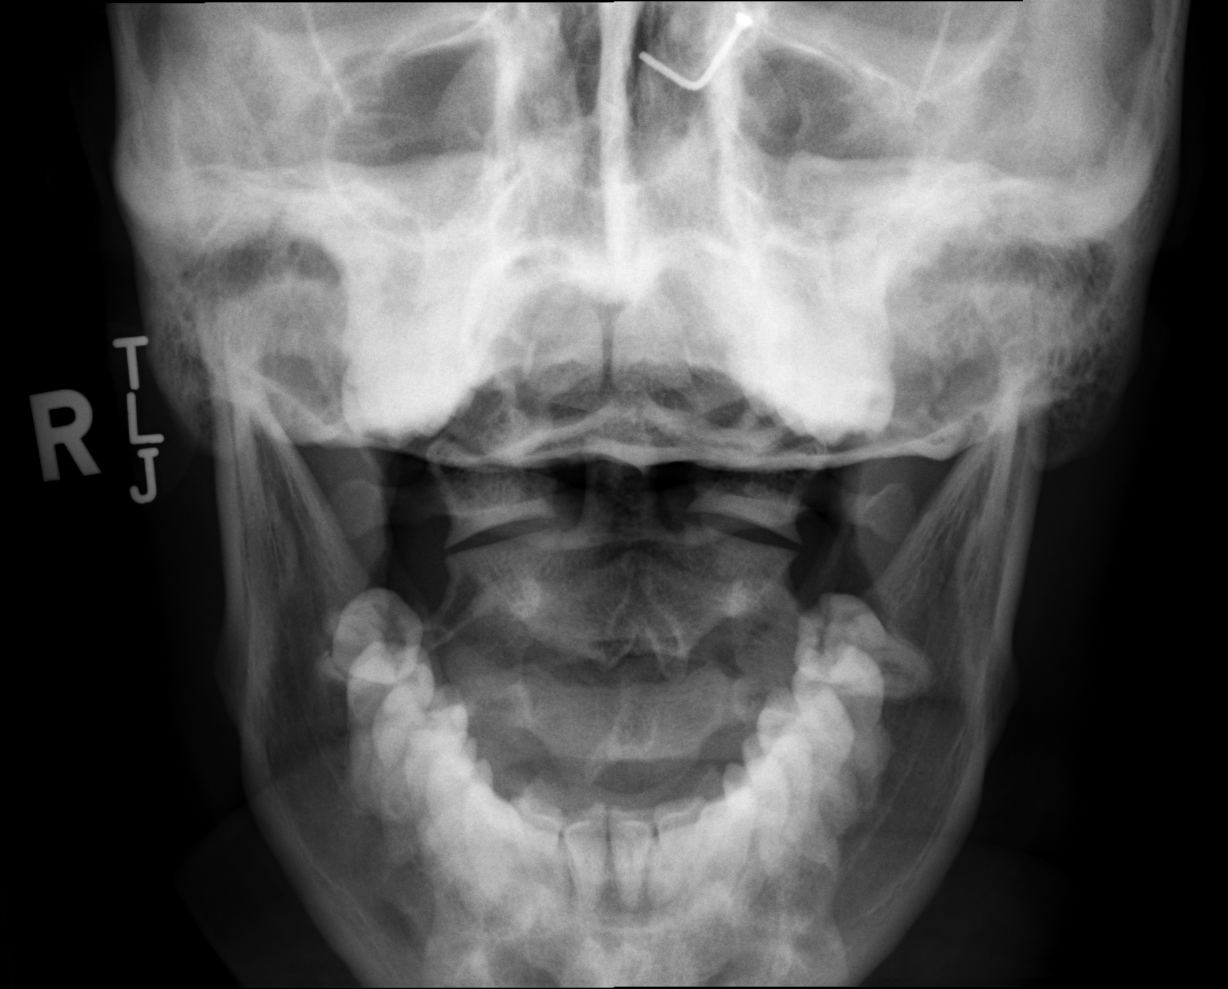

[w cervical spine odontoid (2 of 2)]
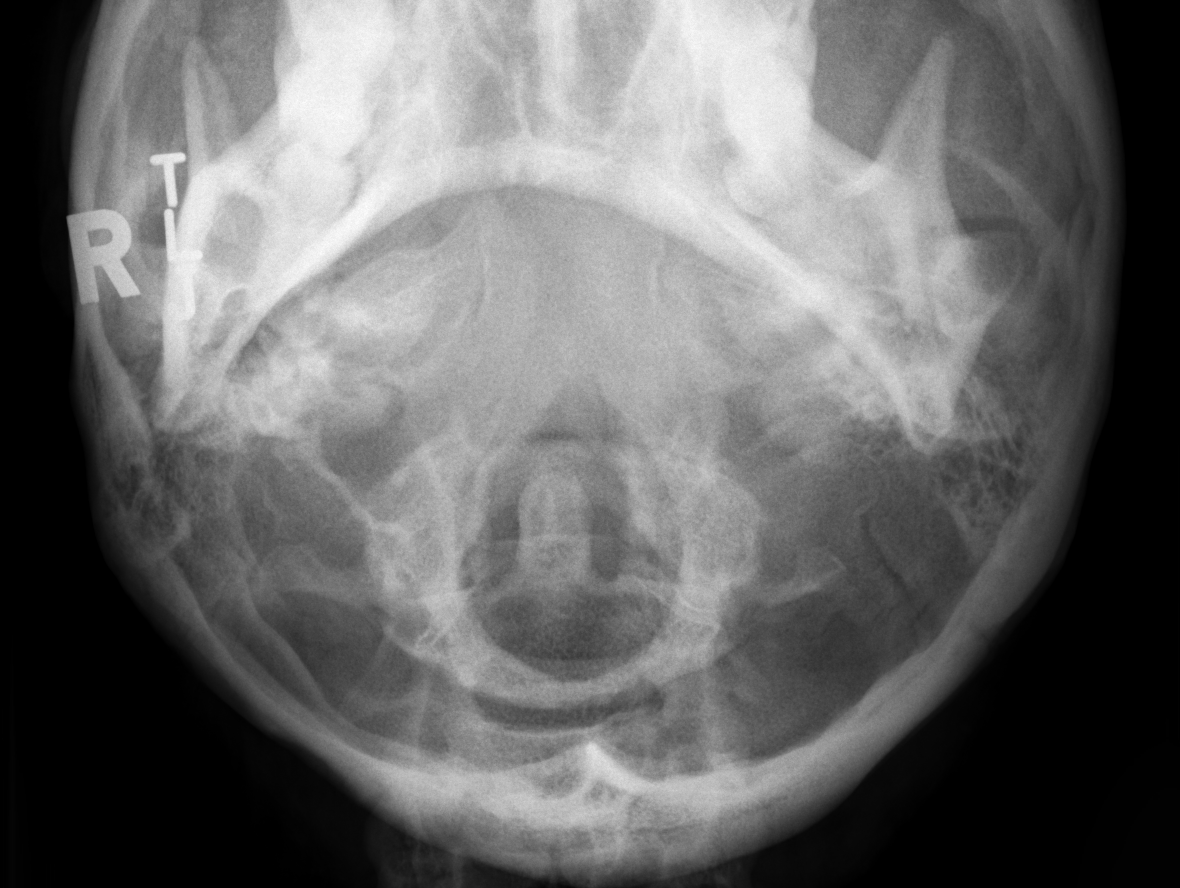

[4 of 4 positions shown; findings below may reference images not displayed]

FINDINGS: Visualization through C7 on lateral view. Straightening of the
normal cervical lordosis. Preservation of the vertebral body
heights. Lung apices are clear. Lateral masses articulate
appropriately with the dens. Prevertebral soft tissues unremarkable.
IMPRESSION: Negative cervical spine radiographs.

## 2023-09-30 DIAGNOSIS — Z124 Encounter for screening for malignant neoplasm of cervix: Secondary | ICD-10-CM | POA: Diagnosis not present

## 2023-09-30 DIAGNOSIS — Z113 Encounter for screening for infections with a predominantly sexual mode of transmission: Secondary | ICD-10-CM | POA: Diagnosis not present

## 2023-11-03 ENCOUNTER — Ambulatory Visit: Payer: Self-pay

## 2023-11-03 NOTE — Telephone Encounter (Signed)
 Copied from CRM [id number]. Topic: Clinical - Red Word Triage >> Nov 03, 2023  3:06 PM Ethelle Herb L wrote: Red Word that prompted transfer to Nurse Triage: headaches for a month, all day - rate pain at a 5   Chief Complaint: Headaches  Symptoms: Pressure-like Headache  Frequency: Over a month  Pertinent Negatives: Patient denies dizziness, visual changes, dyspnea, chest pain, or any other symptoms.   Disposition: [] ED /[] Urgent Care (no appt availability in office) / [x] Appointment(In office/virtual)/ []  [facility]/ [] Home Care/ [] Refused Recommended Disposition /[]  Mobile Bus/ []  Follow-up with PCP Additional Notes: JT is being triaged for headaches going on for a month.  The patient states the headaches are worsened by activity. The patient has a history of migraines and was diagnosed with them a long time ago. No in office availability within an appropriate time frame with the initial PCP, scheduled a one time acute visit with another provider at [facility] at [facility].   Reason for Disposition  [1] MODERATE headache (e.g., interferes with normal activities) AND [2] present > 24 hours AND [3] unexplained  (Exceptions: analgesics not tried, typical migraine, or headache part of viral illness)  Answer Assessment - Initial Assessment Questions 1. LOCATION: "Where does it hurt?"      Pressure all over  2. ONSET: "When did the headache start?" (Minutes, hours or days)      Over a month  3. PATTERN: "Does the pain come and go, or has it been constant since it started?"     Constant  4. SEVERITY: "How bad is the pain?" and "What does it keep you from doing?"  (e.g., Scale 1-10; mild, moderate, or severe)   - MILD (1-3): doesn't interfere with normal activities    - MODERATE (4-7): interferes with normal activities or awakens from sleep    - SEVERE (8-10): excruciating pain, unable to do any normal activities        5   5. RECURRENT SYMPTOM: "Have you ever had  headaches before?" If Yes, ask: "When was the last time?" and "What happened that time?"      Yes, migraines  6. CAUSE: "What do you think is causing the headache?"     Unsure  7. MIGRAINE: "Have you been diagnosed with migraine headaches?" If Yes, ask: "Is this headache similar?"      Previous diagnosis of Migraines  8. HEAD INJURY: "Has there been any recent injury to the head?"      No  9. OTHER SYMPTOMS: "Do you have any other symptoms?" (fever, stiff neck, eye pain, sore throat, cold symptoms)     Neck pain, Fatigue  10. PREGNANCY: "Is there any chance you are pregnant?" "When was your last menstrual period?"       No, LMP, Unsure with Nuva Ring  Protocols used: Headache-A-AH

## 2023-11-04 ENCOUNTER — Ambulatory Visit (HOSPITAL_BASED_OUTPATIENT_CLINIC_OR_DEPARTMENT_OTHER): Admitting: Family Medicine

## 2023-12-31 DIAGNOSIS — Z794 Long term (current) use of insulin: Secondary | ICD-10-CM | POA: Diagnosis not present

## 2023-12-31 DIAGNOSIS — Z87891 Personal history of nicotine dependence: Secondary | ICD-10-CM | POA: Diagnosis not present

## 2023-12-31 DIAGNOSIS — W228XXA Striking against or struck by other objects, initial encounter: Secondary | ICD-10-CM | POA: Diagnosis not present

## 2023-12-31 DIAGNOSIS — S0992XA Unspecified injury of nose, initial encounter: Secondary | ICD-10-CM | POA: Diagnosis not present

## 2023-12-31 DIAGNOSIS — R220 Localized swelling, mass and lump, head: Secondary | ICD-10-CM | POA: Diagnosis not present

## 2023-12-31 DIAGNOSIS — F419 Anxiety disorder, unspecified: Secondary | ICD-10-CM | POA: Diagnosis not present

## 2024-03-05 DIAGNOSIS — R109 Unspecified abdominal pain: Secondary | ICD-10-CM | POA: Diagnosis not present

## 2024-03-05 DIAGNOSIS — R1084 Generalized abdominal pain: Secondary | ICD-10-CM | POA: Diagnosis not present

## 2024-03-07 ENCOUNTER — Emergency Department (HOSPITAL_COMMUNITY)
Admission: EM | Admit: 2024-03-07 | Discharge: 2024-03-07 | Disposition: A | Discharge status: Home / Self Care | Attending: Emergency Medicine | Admitting: Emergency Medicine

## 2024-03-07 ENCOUNTER — Encounter (HOSPITAL_COMMUNITY): Payer: Self-pay

## 2024-03-07 ENCOUNTER — Other Ambulatory Visit: Payer: Self-pay

## 2024-03-07 DIAGNOSIS — R1013 Epigastric pain: Secondary | ICD-10-CM | POA: Insufficient documentation

## 2024-03-07 LAB — URINALYSIS, ROUTINE W REFLEX MICROSCOPIC
Bilirubin Urine: NEGATIVE
Glucose, UA: NEGATIVE mg/dL
Ketones, ur: NEGATIVE mg/dL
Nitrite: NEGATIVE
Protein, ur: NEGATIVE mg/dL
Specific Gravity, Urine: 1.021 (ref 1.005–1.030)
pH: 5 (ref 5.0–8.0)

## 2024-03-07 LAB — COMPREHENSIVE METABOLIC PANEL WITH GFR
ALT: 17 U/L (ref 0–44)
AST: 25 U/L (ref 15–41)
Albumin: 3.5 g/dL (ref 3.5–5.0)
Alkaline Phosphatase: 72 U/L (ref 38–126)
Anion gap: 12 (ref 5–15)
BUN: 8 mg/dL (ref 6–20)
CO2: 20 mmol/L — ABNORMAL LOW (ref 22–32)
Calcium: 9 mg/dL (ref 8.9–10.3)
Chloride: 107 mmol/L (ref 98–111)
Creatinine, Ser: 0.66 mg/dL (ref 0.44–1.00)
GFR, Estimated: >60 mL/min (ref >60)
Glucose, Bld: 98 mg/dL (ref 70–99)
Potassium: 4.2 mmol/L (ref 3.5–5.1)
Sodium: 139 mmol/L (ref 135–145)
Total Bilirubin: 0.5 mg/dL (ref 0.0–1.2)
Total Protein: 7.4 g/dL (ref 6.5–8.1)

## 2024-03-07 LAB — CBC
HCT: 44 % (ref 36.0–46.0)
Hemoglobin: 13.4 g/dL (ref 12.0–15.0)
MCH: 26.2 pg (ref 26.0–34.0)
MCHC: 30.5 g/dL (ref 30.0–36.0)
MCV: 86.1 fL (ref 80.0–100.0)
Platelets: 336 K/uL (ref 150–400)
RBC: 5.11 MIL/uL (ref 3.87–5.11)
RDW: 15.6 % — ABNORMAL HIGH (ref 11.5–15.5)
WBC: 8.7 K/uL (ref 4.0–10.5)
nRBC: 0 % (ref 0.0–0.2)

## 2024-03-07 LAB — LIPASE, BLOOD: Lipase: 32 U/L (ref 11–51)

## 2024-03-07 LAB — HCG, SERUM, QUALITATIVE: Preg, Serum: NEGATIVE

## 2024-03-07 MED ORDER — PANTOPRAZOLE SODIUM 20 MG PO TBEC
20.0000 mg | DELAYED_RELEASE_TABLET | Freq: Every day | ORAL | 0 refills | Status: AC
Start: 2024-03-07 — End: ?

## 2024-03-07 MED ORDER — PANTOPRAZOLE SODIUM 40 MG PO TBEC
40.0000 mg | DELAYED_RELEASE_TABLET | Freq: Once | ORAL | Status: AC
  Administered 2024-03-07: 40 mg via ORAL
  Filled 2024-03-07: qty 1

## 2024-03-07 NOTE — ED Provider Notes (Signed)
 [place] EMERGENCY DEPARTMENT AT [facility] Provider Note   CSN: [id number] Arrival date & time: 03/07/24  9140     Patient presents with: Abdominal Pain   Diane Marshall is a 23 y.o. female.   HPI 23 year old female G3P1A2 with last menstrual period last week.  She states that she has been having abdominal cramping and discomfort once a day since she had a spontaneous abortion at 6 to 7 weeks in February.  She states her GYN told her to wait a few cycles and see if it was just coming on with her cycle.  However, she is continuing to have these.  It is in the upper abdomen not the lower abdomen.  She is not having any associated abnormal vaginal discharge, UTI symptoms, abnormal vaginal bleeding, associated nausea, vomiting, or diarrhea.  She does not associate the pain with food.  She points to the epigastric region and is having cramping area that occurs about once a day for at least several minutes to part of an hour.  Has not had any weight loss.  She has been taking Advil  for relief with this.  She has been taking this almost daily.  She reports not being seen by her primary care physician for this yet.    Prior to Admission medications   Medication Sig Start Date End Date Taking? Authorizing Provider  pantoprazole  (PROTONIX ) 20 MG tablet Take 1 tablet (20 mg total) by mouth daily. 03/07/24  Yes Levander Houston, MD  ferrous sulfate  325 (65 FE) MG tablet Take 1 tablet (325 mg total) by mouth every other day. 09/19/23   Claudene Mort A, DO  HYDROcodone-acetaminophen  (NORCO/VICODIN) 5-325 MG tablet Take 1 tablet by mouth every 4 (four) hours as needed for moderate pain (pain score 4-6).    [provider]  hydrOXYzine (ATARAX) 25 MG tablet Take 25 mg by mouth 3 (three) times daily as needed for anxiety.    [provider]  ibuprofen  (ADVIL ) 600 MG tablet Take 1 tablet (600 mg total) by mouth every 6 (six) hours as needed for cramping. 09/19/23   Claudene Mort A,  DO    Allergies: Patient has no known allergies.    Review of Systems  Updated Vital Signs BP 136/87   Pulse 94   Temp 98.3 F (36.8 C)   Resp 18   Ht 1.6 m (5' 3)   Wt 94.3 kg   LMP 02/29/2024 (Approximate)   SpO2 100%   BMI 36.85 kg/m   Physical Exam Vitals reviewed.  Constitutional:      General: She is not in acute distress.    Appearance: She is well-developed.  HENT:     Head: Normocephalic.     Mouth/Throat:     Mouth: Mucous membranes are moist.  Eyes:     Extraocular Movements: Extraocular movements intact.  Cardiovascular:     Rate and Rhythm: Normal rate and regular rhythm.     Heart sounds: Normal heart sounds.  Pulmonary:     Effort: Pulmonary effort is normal.     Breath sounds: Normal breath sounds.  Abdominal:     General: Abdomen is flat. Bowel sounds are normal.     Palpations: Abdomen is soft.     Tenderness: There is abdominal tenderness in the epigastric area.  Skin:    General: Skin is warm and dry.  Neurological:     General: No focal deficit present.     Mental Status: She is alert.  Psychiatric:  Mood and Affect: Mood normal.     (all labs ordered are listed, but only abnormal results are displayed) Labs Reviewed  COMPREHENSIVE METABOLIC PANEL WITH GFR - Abnormal; Notable for the following components:      Result Value   CO2 20 (*)    All other components within normal limits  CBC - Abnormal; Notable for the following components:   RDW 15.6 (*)    All other components within normal limits  URINALYSIS, ROUTINE W REFLEX MICROSCOPIC - Abnormal; Notable for the following components:   APPearance HAZY (*)    Hgb urine dipstick SMALL (*)    Leukocytes,Ua MODERATE (*)    Bacteria, UA RARE (*)    All other components within normal limits  LIPASE, BLOOD  HCG, SERUM, QUALITATIVE    EKG: None  Radiology: No results found.   Procedures   Medications Ordered in the ED  pantoprazole  (PROTONIX ) EC tablet 40 mg (40 mg  Oral Given 03/07/24 0948)    Clinical Course as of 03/07/24 1051  Sun Mar 07, 2024  1048 CBC reviewed interpreted and within normal limits Serum pregnancy test is negative Lipase is normal at 32 Pleat metabolic panel reviewed interpreted send for CO2 of 20 otherwise within normal limits [DR]    Clinical Course User Index [DR] Levander Houston, MD                                 Medical Decision Making Amount and/or Complexity of Data Reviewed Labs: ordered.  Risk Prescription drug management.   23 year old female with intermittent epigastric discomfort on a daily basis for the past 4 months.  Symptoms are not associated with eating, nausea, vomiting, diarrhea, fever, chills or weight loss.  She has mild epigastric tenderness here on palpation. Differential diagnosis includes but is not limited to gastritis, reflux, gallstones, pancreatitis as patient is having minimal tenderness on her exam, do not feel a CT is indicated. Patient evaluated here with labs.  Pregnancy test is negative.  Lipase is normal doubt pancreatitis LFTs are not elevated. White blood cell count is normal.  Hemoglobin is normal doubt any acute bleeding. This may indicate some gastritis as patient has been taking anti-inflammatories daily. Plan Protonix  patient advised regarding need for follow-up and return precautions and voices understanding.     Final diagnoses:  Epigastric pain    ED Discharge Orders          Ordered    pantoprazole  (PROTONIX ) 20 MG tablet  Daily        03/07/24 1050               Levander Houston, MD 03/07/24 1051

## 2024-03-07 NOTE — ED Notes (Signed)
 Patient reported improvement in symptoms after protonix . DC instructions reviewed

## 2024-03-07 NOTE — Discharge Instructions (Addendum)
 You were evaluated here in the emergency department today for abdominal pain. You had a complete blood cell count done that is normal.  Your pancreatic enzymes are normal. Your liver and function tests are normal. Please stop using the nonsteroidal anti-inflammatories such as ibuprofen  or Advil  on a daily basis.  Please begin using the Protonix  that is prescribed for you. Please follow-up with your doctor this week.  Return here if you are having any new or worsening symptoms.

## 2024-03-07 NOTE — ED Triage Notes (Signed)
 Pt states she has had abdominal cramping since February, states it worsened today. Pt denies nausea, vomiting, or diarrhea. Pt seen recently for same. Pt states pain worsens w/movement.

## 2024-04-26 ENCOUNTER — Ambulatory Visit

## 2024-04-29 DIAGNOSIS — O99891 Other specified diseases and conditions complicating pregnancy: Secondary | ICD-10-CM | POA: Diagnosis not present

## 2024-04-29 DIAGNOSIS — R350 Frequency of micturition: Secondary | ICD-10-CM | POA: Diagnosis not present

## 2024-04-29 DIAGNOSIS — O26893 Other specified pregnancy related conditions, third trimester: Secondary | ICD-10-CM | POA: Diagnosis not present

## 2024-04-29 DIAGNOSIS — M549 Dorsalgia, unspecified: Secondary | ICD-10-CM | POA: Diagnosis not present

## 2024-04-29 DIAGNOSIS — Z3A39 39 weeks gestation of pregnancy: Secondary | ICD-10-CM | POA: Diagnosis not present

## 2024-05-04 DIAGNOSIS — N911 Secondary amenorrhea: Secondary | ICD-10-CM | POA: Diagnosis not present

## 2024-05-18 DIAGNOSIS — Z3A11 11 weeks gestation of pregnancy: Secondary | ICD-10-CM | POA: Diagnosis not present

## 2024-05-18 DIAGNOSIS — Z3400 Encounter for supervision of normal first pregnancy, unspecified trimester: Secondary | ICD-10-CM | POA: Diagnosis not present

## 2024-05-18 DIAGNOSIS — Z3685 Encounter for antenatal screening for Streptococcus B: Secondary | ICD-10-CM | POA: Diagnosis not present

## 2024-05-18 DIAGNOSIS — Z3182 Encounter for Rh incompatibility status: Secondary | ICD-10-CM | POA: Diagnosis not present

## 2024-05-25 DIAGNOSIS — Z113 Encounter for screening for infections with a predominantly sexual mode of transmission: Secondary | ICD-10-CM | POA: Diagnosis not present

## 2024-05-25 DIAGNOSIS — Z3A12 12 weeks gestation of pregnancy: Secondary | ICD-10-CM | POA: Diagnosis not present

## 2024-05-25 DIAGNOSIS — Z124 Encounter for screening for malignant neoplasm of cervix: Secondary | ICD-10-CM | POA: Diagnosis not present

## 2024-05-25 DIAGNOSIS — Z3481 Encounter for supervision of other normal pregnancy, first trimester: Secondary | ICD-10-CM | POA: Diagnosis not present

## 2024-07-07 DIAGNOSIS — Z361 Encounter for antenatal screening for raised alphafetoprotein level: Secondary | ICD-10-CM | POA: Diagnosis not present
